# Patient Record
Sex: Female | Born: 1970 | Race: White | Hispanic: No | Smoking: Never smoker
Health system: Southern US, Community
[De-identification: ages and names within clinical notes are randomized; demographics above are authoritative.]

## PROBLEM LIST (undated history)

## (undated) DIAGNOSIS — M1990 Unspecified osteoarthritis, unspecified site: Secondary | ICD-10-CM

## (undated) DIAGNOSIS — E785 Hyperlipidemia, unspecified: Secondary | ICD-10-CM

## (undated) DIAGNOSIS — F32A Depression, unspecified: Secondary | ICD-10-CM

## (undated) HISTORY — DX: Hyperlipidemia, unspecified: E78.5

## (undated) HISTORY — DX: Depression, unspecified: F32.A

## (undated) HISTORY — DX: Unspecified osteoarthritis, unspecified site: M19.90

---

## 1994-02-03 HISTORY — PX: CHOLECYSTECTOMY: SHX55

## 2013-03-10 DIAGNOSIS — G43109 Migraine with aura, not intractable, without status migrainosus: Secondary | ICD-10-CM | POA: Insufficient documentation

## 2015-06-15 DIAGNOSIS — N809 Endometriosis, unspecified: Secondary | ICD-10-CM | POA: Insufficient documentation

## 2018-11-08 DIAGNOSIS — M1990 Unspecified osteoarthritis, unspecified site: Secondary | ICD-10-CM | POA: Insufficient documentation

## 2020-01-25 ENCOUNTER — Encounter: Payer: Self-pay | Admitting: Family Medicine

## 2020-01-25 ENCOUNTER — Other Ambulatory Visit: Payer: Self-pay

## 2020-01-25 ENCOUNTER — Ambulatory Visit: Payer: No Typology Code available for payment source | Admitting: Family Medicine

## 2020-01-25 VITALS — BP 127/76 | HR 81 | Temp 98.9°F | Ht 61.0 in | Wt 177.2 lb

## 2020-01-25 DIAGNOSIS — Z1211 Encounter for screening for malignant neoplasm of colon: Secondary | ICD-10-CM

## 2020-01-25 DIAGNOSIS — E7800 Pure hypercholesterolemia, unspecified: Secondary | ICD-10-CM

## 2020-01-25 DIAGNOSIS — N809 Endometriosis, unspecified: Secondary | ICD-10-CM

## 2020-01-25 DIAGNOSIS — Z1231 Encounter for screening mammogram for malignant neoplasm of breast: Secondary | ICD-10-CM

## 2020-01-25 DIAGNOSIS — Z6833 Body mass index (BMI) 33.0-33.9, adult: Secondary | ICD-10-CM | POA: Diagnosis not present

## 2020-01-25 DIAGNOSIS — K219 Gastro-esophageal reflux disease without esophagitis: Secondary | ICD-10-CM

## 2020-01-25 DIAGNOSIS — F331 Major depressive disorder, recurrent, moderate: Secondary | ICD-10-CM | POA: Diagnosis not present

## 2020-01-25 DIAGNOSIS — Z0000 Encounter for general adult medical examination without abnormal findings: Secondary | ICD-10-CM

## 2020-01-25 MED ORDER — OMEPRAZOLE 20 MG PO CPDR: 20.0000 mg | DELAYED_RELEASE_CAPSULE | Freq: Every day | ORAL | 2 refills | Status: AC

## 2020-01-25 MED ORDER — BUPROPION HCL ER (XL) 150 MG PO TB24: 150.0000 mg | ORAL_TABLET | Freq: Every day | ORAL | 2 refills | Status: DC

## 2020-01-25 NOTE — Patient Instructions (Signed)
American Heart Association (AHA) Exercise Recommendation  Being physically active is important to prevent heart disease and stroke, the nation's No. 1and No. 5killers. To improve overall cardiovascular health, we suggest at least 150 minutes per week of moderate exercise or 75 minutes per week of vigorous exercise (or a combination of moderate and vigorous activity). Thirty minutes a day, five times a week is an easy goal to remember. You will also experience benefits even if you divide your time into two or three segments of 10 to 15 minutes per day.  For people who would benefit from lowering their blood pressure or cholesterol, we recommend 40 minutes of aerobic exercise of moderate to vigorous intensity three to four times a week to lower the risk for heart attack and stroke.  Physical activity is anything that makes you move your body and burn calories.  This includes things like climbing stairs or playing sports. Aerobic exercises benefit your heart, and include walking, jogging, swimming or biking. Strength and stretching exercises are best for overall stamina and flexibility.  The simplest, positive change you can make to effectively improve your heart health is to start walking. It's enjoyable, free, easy, social and great exercise. A walking program is flexible and boasts high success rates because people can stick with it. It's easy for walking to become a regular and satisfying part of life.   For Overall Cardiovascular Health:  At least 30 minutes of moderate-intensity aerobic activity at least 5 days per week for a total of 150  OR   At least 25 minutes of vigorous aerobic activity at least 3 days per week for a total of 75 minutes; or a combination of moderate- and vigorous-intensity aerobic activity  AND   Moderate- to high-intensity muscle-strengthening activity at least 2 days per week for additional health benefits.  For Lowering Blood Pressure and Cholesterol  An  average 40 minutes of moderate- to vigorous-intensity aerobic activity 3 or 4 times per week  What if I can't make it to the time goal? Something is always better than nothing! And everyone has to start somewhere. Even if you've been sedentary for years, today is the day you can begin to make healthy changes in your life. If you don't think you'll make it for 30 or 40 minutes, set a reachable goal for today. You can work up toward your overall goal by increasing your time as you get stronger. Don't let all-or-nothing thinking rob you of doing what you can every day.  Source:http://www.heart.org   High Cholesterol  High cholesterol is a condition in which the blood has high levels of a white, waxy, fat-like substance (cholesterol). The human body needs small amounts of cholesterol. The liver makes all the cholesterol that the body needs. Extra (excess) cholesterol comes from the food that we eat. Cholesterol is carried from the liver by the blood through the blood vessels. If you have high cholesterol, deposits (plaques) may build up on the walls of your blood vessels (arteries). Plaques make the arteries narrower and stiffer. Cholesterol plaques increase your risk for heart attack and stroke. Work with your health care provider to keep your cholesterol levels in a healthy range. What increases the risk? This condition is more likely to develop in people who:  Eat foods that are high in animal fat (saturated fat) or cholesterol.  Are overweight.  Are not getting enough exercise.  Have a family history of high cholesterol. What are the signs or symptoms? There are no symptoms  of this condition. How is this diagnosed? This condition may be diagnosed from the results of a blood test.  If you are older than age 51, your health care provider may check your cholesterol every 4-6 years.  You may be checked more often if you already have high cholesterol or other risk factors for heart  disease. The blood test for cholesterol measures:  "Bad" cholesterol (LDL cholesterol). This is the main type of cholesterol that causes heart disease. The desired level for LDL is less than 100.  "Good" cholesterol (HDL cholesterol). This type helps to protect against heart disease by cleaning the arteries and carrying the LDL away. The desired level for HDL is 60 or higher.  Triglycerides. These are fats that the body can store or burn for energy. The desired number for triglycerides is lower than 150.  Total cholesterol. This is a measure of the total amount of cholesterol in your blood, including LDL cholesterol, HDL cholesterol, and triglycerides. A healthy number is less than 200. How is this treated? This condition is treated with diet changes, lifestyle changes, and medicines. Diet changes  This may include eating more whole grains, fruits, vegetables, nuts, and fish.  This may also include cutting back on red meat and foods that have a lot of added sugar. Lifestyle changes  Changes may include getting at least 40 minutes of aerobic exercise 3 times a week. Aerobic exercises include walking, biking, and swimming. Aerobic exercise along with a healthy diet can help you maintain a healthy weight.  Changes may also include quitting smoking. Medicines  Medicines are usually given if diet and lifestyle changes have failed to reduce your cholesterol to healthy levels.  Your health care provider may prescribe a statin medicine. Statin medicines have been shown to reduce cholesterol, which can reduce the risk of heart disease. Follow these instructions at home: Eating and drinking If told by your health care provider:  Eat chicken (without skin), fish, veal, shellfish, ground [place] breast, and round or loin cuts of red meat.  Do not eat fried foods or fatty meats, such as hot dogs and salami.  Eat plenty of fruits, such as apples.  Eat plenty of vegetables, such as broccoli,  potatoes, and carrots.  Eat beans, peas, and lentils.  Eat grains such as barley, rice, couscous, and bulgur wheat.  Eat pasta without cream sauces.  Use skim or nonfat milk, and eat low-fat or nonfat yogurt and cheeses.  Do not eat or drink whole milk, cream, ice cream, egg yolks, or hard cheeses.  Do not eat stick margarine or tub margarines that contain trans fats (also called partially hydrogenated oils).  Do not eat saturated tropical oils, such as coconut oil and palm oil.  Do not eat cakes, cookies, crackers, or other baked goods that contain trans fats.  General instructions  Exercise as directed by your health care provider. Increase your activity level with activities such as gardening, walking, and taking the stairs.  Take over-the-counter and prescription medicines only as told by your health care provider.  Do not use any products that contain nicotine or tobacco, such as cigarettes and e-cigarettes. If you need help quitting, ask your health care provider.  Keep all follow-up visits as told by your health care provider. This is important. Contact a health care provider if:  You are struggling to maintain a healthy diet or weight.  You need help to start on an exercise program.  You need help to stop smoking. Get  help right away if:  You have chest pain.  You have trouble breathing. This information is not intended to replace advice given to you by your health care provider. Make sure you discuss any questions you have with your health care provider. Document Revised: 01/23/2017 Document Reviewed: 07/21/2015 Elsevier Patient Education  [place].

## 2020-01-25 NOTE — Progress Notes (Signed)
New Patient Office Visit  Subjective:  Patient ID: Amanda [name], female    DOB: 07/12/1970  Age: 49 y.o. MRN: [id number]  CC:  Chief Complaint  Patient presents with  . New Patient (Initial Visit)    HPI [name] presents to establish care.   1. Depression Yilin reports that she has been on and off of Wellbutrin for last 25 years. She has been off of it for the past year. She has noticed an increase in symptoms and feels like it is time to restart her medication. She previously did well on 150 mg daily. She is going though a separation and has started a new job.   Depression screen [facility] 2/9 01/25/2020  Decreased Interest 3  Down, Depressed, Hopeless 3  PHQ - 2 Score 6  Altered sleeping 0  Tired, decreased energy 1  Change in appetite 2  Feeling bad or failure about yourself  0  Trouble concentrating 0  Moving slowly or fidgety/restless 0  Suicidal thoughts 0  PHQ-9 Score 9  Difficult doing work/chores Somewhat difficult    Past Medical History:  Diagnosis Date  . Arthritis   . Depression   . Hyperlipidemia     Past Surgical History:  Procedure Laterality Date  . CHOLECYSTECTOMY  1996    Family History  Problem Relation Age of Onset  . Arthritis Mother   . Hyperlipidemia Mother   . Hypertension Mother   . Alcohol abuse Father   . Cancer Father        lung  . Alcohol abuse Brother   . Anxiety disorder Brother   . Asthma Brother   . Depression Brother   . Alcohol abuse Brother   . Asthma Brother   . Cancer Brother        esophageal  . COPD Brother     Social History   Socioeconomic History  . Marital status: Legally Separated    Spouse name: Not on file  . Number of children: 1  . Years of education: 74  . Highest education level: High school graduate  Occupational History  . Occupation: Accounting  Tobacco Use  . Smoking status: Never Smoker  . Smokeless tobacco: Never Used  Vaping Use  . Vaping Use: Never used  Substance and Sexual  Activity  . Alcohol use: Not Currently  . Drug use: Never  . Sexual activity: Not Currently    Birth control/protection: Post-menopausal  Other Topics Concern  . Not on file  Social History Narrative  . Not on file   Social Determinants of Health   Financial Resource Strain: Not on file  Food Insecurity: Not on file  Transportation Needs: Not on file  Physical Activity: Not on file  Stress: Not on file  Social Connections: Not on file  Intimate Partner Violence: Not on file    ROS Review of Systems Negative unless specially indicated above in HPI.  Objective:   Today's Vitals: BP 127/76   Pulse 81   Temp 98.9 F (37.2 C) (Temporal)   Ht _0  (1.549 m)   Wt 177 lb 4 oz (80.4 kg)   BMI 33.49 kg/m   Physical Exam Vitals and nursing note reviewed.  Constitutional:      General: She is not in acute distress.    Appearance: Normal appearance. She is not ill-appearing, toxic-appearing or diaphoretic.  HENT:     Head: Normocephalic and atraumatic.  Neck:     Vascular: No carotid bruit.  Cardiovascular:  Rate and Rhythm: Normal rate and regular rhythm.     Heart sounds: Normal heart sounds. No murmur heard.   Pulmonary:     Effort: Pulmonary effort is normal. No respiratory distress.     Breath sounds: Normal breath sounds.  Musculoskeletal:     Cervical back: Neck supple.     Right lower leg: No edema.     Left lower leg: No edema.  Skin:    General: Skin is warm and dry.  Neurological:     General: No focal deficit present.     Mental Status: She is alert and oriented to person, place, and time.  Psychiatric:        Mood and Affect: Mood normal.        Behavior: Behavior normal.        Thought Content: Thought content normal.        Judgment: Judgment normal.     Assessment & Plan:   Kelley was seen today for new patient (initial visit).  Diagnoses and all orders for this visit:  Moderate episode of recurrent major depressive disorder (HCC) PHQ  9 score is 9 today. Restart wellbutrin 150 mg daily.  -     buPROPion (WELLBUTRIN XL) 150 MG 24 hr tablet; Take 1 tablet (150 mg total) by mouth daily.  BMI 33.0-33.9,adult Labs pending as below. She has not had anything to eat since this morning. -     CBC with Differential/Platelet -     CMP14+EGFR -     Lipid panel -     TSH  Gastroesophageal reflux disease without esophagitis Well controlled on current regimen.  -     omeprazole (PRILOSEC) 20 MG capsule; Take 1 capsule (20 mg total) by mouth daily.  Hypercholesteremia Labs pending as below.  -     Lipid panel  Endometriosis Managed by [facility].  Encounter for screening mammogram for malignant neoplasm of breast -     MM Digital Screening; Future  Colon cancer screening -     Cologuard  Encounter for medical examination to establish care -     CBC with Differential/Platelet -     CMP14+EGFR -     Lipid panel -     TSH  Follow-up: 3 months for CPE with pap, sooner if needed.   The patient indicates understanding of these issues and agrees with the plan.   Gwenlyn Perking, FNP

## 2020-01-26 ENCOUNTER — Telehealth: Payer: Self-pay | Admitting: Family Medicine

## 2020-01-26 LAB — CMP14+EGFR
ALT: 14 IU/L (ref 0–32)
AST: 14 IU/L (ref 0–40)
Albumin/Globulin Ratio: 1.1 — ABNORMAL LOW (ref 1.2–2.2)
Albumin: 3.9 g/dL (ref 3.8–4.9)
Alkaline Phosphatase: 78 IU/L (ref 44–121)
BUN/Creatinine Ratio: 14 (ref 9–23)
BUN: 11 mg/dL (ref 6–24)
Bilirubin Total: 0.3 mg/dL (ref 0.0–1.2)
CO2: 21 mmol/L (ref 20–29)
Calcium: 9.7 mg/dL (ref 8.7–10.2)
Chloride: 101 mmol/L (ref 96–106)
Creatinine, Ser: 0.77 mg/dL (ref 0.57–1.00)
GFR calc Af Amer: 103 mL/min/{1.73_m2} (ref >59)
GFR calc non Af Amer: 90 mL/min/{1.73_m2} (ref >59)
Globulin, Total: 3.5 g/dL (ref 1.5–4.5)
Glucose: 78 mg/dL (ref 65–99)
Potassium: 4.2 mmol/L (ref 3.5–5.2)
Sodium: 137 mmol/L (ref 134–144)
Total Protein: 7.4 g/dL (ref 6.0–8.5)

## 2020-01-26 LAB — CBC WITH DIFFERENTIAL/PLATELET
Basophils Absolute: 0.1 10*3/uL (ref 0.0–0.2)
Basos: 1 %
EOS (ABSOLUTE): 0.1 10*3/uL (ref 0.0–0.4)
Eos: 2 %
Hematocrit: 37.3 % (ref 34.0–46.6)
Hemoglobin: 12.3 g/dL (ref 11.1–15.9)
Immature Grans (Abs): 0 10*3/uL (ref 0.0–0.1)
Immature Granulocytes: 0 %
Lymphocytes Absolute: 2.2 10*3/uL (ref 0.7–3.1)
Lymphs: 29 %
MCH: 29.6 pg (ref 26.6–33.0)
MCHC: 33 g/dL (ref 31.5–35.7)
MCV: 90 fL (ref 79–97)
Monocytes Absolute: 0.5 10*3/uL (ref 0.1–0.9)
Monocytes: 7 %
Neutrophils Absolute: 4.6 10*3/uL (ref 1.4–7.0)
Neutrophils: 61 %
Platelets: 418 10*3/uL (ref 150–450)
RBC: 4.15 x10E6/uL (ref 3.77–5.28)
RDW: 13.3 % (ref 11.7–15.4)
WBC: 7.5 10*3/uL (ref 3.4–10.8)

## 2020-01-26 LAB — TSH: TSH: 1.11 u[IU]/mL (ref 0.450–4.500)

## 2020-01-26 LAB — LIPID PANEL
Chol/HDL Ratio: 2.6 ratio (ref 0.0–4.4)
Cholesterol, Total: 288 mg/dL — ABNORMAL HIGH (ref 100–199)
HDL: 109 mg/dL (ref >39)
LDL Chol Calc (NIH): 153 mg/dL — ABNORMAL HIGH (ref 0–99)
Triglycerides: 153 mg/dL — ABNORMAL HIGH (ref 0–149)
VLDL Cholesterol Cal: 26 mg/dL (ref 5–40)

## 2020-01-26 NOTE — Telephone Encounter (Signed)
Refer to lab note °

## 2020-01-26 NOTE — Telephone Encounter (Signed)
Pt calling back about results from blood work

## 2020-02-21 ENCOUNTER — Other Ambulatory Visit: Payer: Self-pay | Admitting: Family Medicine

## 2020-02-21 DIAGNOSIS — Z1231 Encounter for screening mammogram for malignant neoplasm of breast: Secondary | ICD-10-CM

## 2020-02-29 ENCOUNTER — Ambulatory Visit
Admission: RE | Admit: 2020-02-29 | Discharge: 2020-02-29 | Disposition: A | Discharge status: Home / Self Care | Payer: No Typology Code available for payment source | Source: Ambulatory Visit | Attending: Family Medicine | Admitting: Family Medicine

## 2020-02-29 ENCOUNTER — Other Ambulatory Visit: Payer: Self-pay

## 2020-02-29 DIAGNOSIS — Z1231 Encounter for screening mammogram for malignant neoplasm of breast: Secondary | ICD-10-CM

## 2020-03-01 ENCOUNTER — Other Ambulatory Visit: Payer: Self-pay | Admitting: *Deleted

## 2020-03-02 ENCOUNTER — Encounter: Payer: Self-pay | Admitting: Family Medicine

## 2020-03-02 MED ORDER — DROSPIRENONE-ETHINYL ESTRADIOL 3-0.03 MG PO TABS: ORAL_TABLET | ORAL | 0 refills | Status: DC

## 2020-03-29 ENCOUNTER — Encounter: Payer: Self-pay | Admitting: *Deleted

## 2020-04-26 ENCOUNTER — Ambulatory Visit (INDEPENDENT_AMBULATORY_CARE_PROVIDER_SITE_OTHER): Payer: No Typology Code available for payment source | Admitting: Family Medicine

## 2020-04-26 ENCOUNTER — Encounter: Payer: Self-pay | Admitting: Family Medicine

## 2020-04-26 ENCOUNTER — Other Ambulatory Visit: Payer: Self-pay

## 2020-04-26 VITALS — BP 138/75 | HR 98 | Temp 99.3°F | Ht 61.0 in | Wt 181.1 lb

## 2020-04-26 DIAGNOSIS — Z0001 Encounter for general adult medical examination with abnormal findings: Secondary | ICD-10-CM

## 2020-04-26 DIAGNOSIS — N809 Endometriosis, unspecified: Secondary | ICD-10-CM | POA: Diagnosis not present

## 2020-04-26 DIAGNOSIS — Z124 Encounter for screening for malignant neoplasm of cervix: Secondary | ICD-10-CM | POA: Diagnosis not present

## 2020-04-26 DIAGNOSIS — Z0000 Encounter for general adult medical examination without abnormal findings: Secondary | ICD-10-CM

## 2020-04-26 DIAGNOSIS — E669 Obesity, unspecified: Secondary | ICD-10-CM | POA: Diagnosis not present

## 2020-04-26 DIAGNOSIS — F331 Major depressive disorder, recurrent, moderate: Secondary | ICD-10-CM

## 2020-04-26 MED ORDER — DROSPIRENONE-ETHINYL ESTRADIOL 3-0.03 MG PO TABS: ORAL_TABLET | ORAL | 1 refills | Status: DC

## 2020-04-26 NOTE — Progress Notes (Signed)
Amanda [name] is a 50 y.o. female presents to office today for annual physical exam examination.    Concerns today include: 1. No concerns  Occupation: accounts payable, Marital status: seperated, Substance use: denies Diet: fair, Exercise: walks, garden, yard work Last eye exam: 2-3 years ago Last dental exam: regularly Last colonoscopy: has cologuard at home Last mammogram: 1/22 Last pap smear: 3-4 years ago Refills needed today: yasmin  Depression screen [facility] 2/9 04/26/2020 01/25/2020  Decreased Interest 0 3  Down, Depressed, Hopeless 1 3  PHQ - 2 Score 1 6  Altered sleeping 0 0  Tired, decreased energy 2 1  Change in appetite 2 2  Feeling bad or failure about yourself  0 0  Trouble concentrating 0 0  Moving slowly or fidgety/restless 0 0  Suicidal thoughts 0 0  PHQ-9 Score 5 9  Difficult doing work/chores Somewhat difficult Somewhat difficult    Past Medical History:  Diagnosis Date  . Arthritis   . Depression   . Hyperlipidemia    Social History   Socioeconomic History  . Marital status: Legally Separated    Spouse name: Not on file  . Number of children: 1  . Years of education: 44  . Highest education level: High school graduate  Occupational History  . Occupation: Accounting  Tobacco Use  . Smoking status: Never Smoker  . Smokeless tobacco: Never Used  Vaping Use  . Vaping Use: Never used  Substance and Sexual Activity  . Alcohol use: Not Currently  . Drug use: Never  . Sexual activity: Not Currently    Birth control/protection: Post-menopausal  Other Topics Concern  . Not on file  Social History Narrative  . Not on file   Social Determinants of Health   Financial Resource Strain: Not on file  Food Insecurity: Not on file  Transportation Needs: Not on file  Physical Activity: Not on file  Stress: Not on file  Social Connections: Not on file  Intimate Partner Violence: Not on file   Past Surgical History:  Procedure Laterality Date  .  CHOLECYSTECTOMY  1996   Family History  Problem Relation Age of Onset  . Arthritis Mother   . Hyperlipidemia Mother   . Hypertension Mother   . Alcohol abuse Father   . Cancer Father        lung  . Alcohol abuse Brother   . Anxiety disorder Brother   . Asthma Brother   . Depression Brother   . Alcohol abuse Brother   . Asthma Brother   . Cancer Brother        esophageal  . COPD Brother     Current Outpatient Medications:  .  aspirin-acetaminophen-caffeine (EXCEDRIN MIGRAINE) 250-250-65 MG tablet, Take by mouth., Disp: , Rfl:  .  buPROPion (WELLBUTRIN XL) 150 MG 24 hr tablet, Take 1 tablet (150 mg total) by mouth daily., Disp: 90 tablet, Rfl: 2 .  Cholecalciferol (VITAMIN D3) 25 MCG (1000 UT) CAPS, Take 1,000 Units by mouth daily., Disp: , Rfl:  .  drospirenone-ethinyl estradiol (YASMIN) 3-0.03 MG tablet, TAKE 1 ACTIVE TABLET BY  MOUTH DAILY, Disp: 84 tablet, Rfl: 0 .  omeprazole (PRILOSEC) 20 MG capsule, Take 1 capsule (20 mg total) by mouth daily., Disp: 90 capsule, Rfl: 2 .  TURMERIC PO, Take 1,200 mg by mouth daily., Disp: , Rfl:  .  vitamin B-12 (CYANOCOBALAMIN) 500 MCG tablet, Take by mouth., Disp: , Rfl:   No Known Allergies   ROS: Review of Systems Pertinent  items noted in HPI and remainder of comprehensive ROS otherwise negative.    Physical exam BP 138/75   Pulse 98   Temp 99.3 F (37.4 C) (Temporal)   Ht $R'5\' 1"'Sp$  (1.549 m)   Wt 181 lb 2 oz (82.2 kg)   BMI 34.22 kg/m  General appearance: alert, cooperative and no distress Head: Normocephalic, without obvious abnormality, atraumatic Eyes: conjunctivae/corneas clear. PERRL, EOM's intact.  Ears: normal TM's and external ear canals both ears Nose: Nares normal. Septum midline. Mucosa normal. No drainage or sinus tenderness. Throat: lips, mucosa, and tongue normal; teeth and gums normal Neck: no adenopathy, no carotid bruit, no JVD, supple, symmetrical, trachea midline and thyroid not enlarged, symmetric, no  tenderness/mass/nodules Back: symmetric, no curvature. ROM normal. No CVA tenderness. Lungs: clear to auscultation bilaterally Heart: regular rate and rhythm, S1, S2 normal, no murmur, click, rub or gallop Abdomen: soft, non-tender; bowel sounds normal; no masses,  no organomegaly Pelvic: cervix normal in appearance, external genitalia normal, no adnexal masses or tenderness, no cervical motion tenderness, rectovaginal septum normal, uterus normal size, shape, and consistency and vagina normal without discharge Extremities: extremities normal, atraumatic, no cyanosis or edema Pulses: 2+ and symmetric Skin: Skin color, texture, turgor normal. No rashes or lesions Lymph nodes: Cervical, supraclavicular, and axillary nodes normal. Neurologic: Alert and oriented X 3, normal strength and tone. Normal symmetric reflexes. Normal coordination and gait    Assessment/ Plan: Amanda [name] here for annual physical exam.   Amanda [name] was seen today for annual exam.  Diagnoses and all orders for this visit:  Routine general medical examination at a health care facility Exam unremarkable. Labs pending as below.  -     CBC with Differential/Platelet -     CMP14+EGFR -     Lipid panel -     TSH -     VITAMIN D 25 Hydroxy (Vit-D Deficiency, Fractures) -     Vitamin B12  Obesity (BMI 30.0-34.9) Labs pending as below. Diet and exercise.  -     CBC with Differential/Platelet -     CMP14+EGFR -     Lipid panel -     TSH  Screening for cervical cancer -     Cytology - PAP  Endometriosis Refill provided. Well controlled on current regimen.  -     drospirenone-ethinyl estradiol (YASMIN) 3-0.03 MG tablet; TAKE 1 ACTIVE TABLET BY  MOUTH DAILY  Depression PHQ 9 score of 5 today, improved from 9 at previous visit. She feels like she is doing well on Wellbutrin and does not want to increase this at time time.   Counseled on healthy lifestyle choices, including diet (rich in fruits, vegetables and lean  meats and low in salt and simple carbohydrates) and exercise (at least 30 minutes of moderate physical activity daily).  Patient to follow up in 1 year for annual exam or sooner if needed.  The above assessment and management plan was discussed with the patient. The patient verbalized understanding of and has agreed to the management plan. Patient is aware to call the clinic if symptoms persist or worsen. Patient is aware when to return to the clinic for a follow-up visit. Patient educated on when it is appropriate to go to the emergency department.   Amanda Smolder, FNP-C [facility] [address] [place], [place] [address] [phone]

## 2020-04-26 NOTE — Patient Instructions (Signed)
Health Maintenance, Female Adopting a healthy lifestyle and getting preventive care are important in promoting health and wellness. Ask your health care provider about:  The right schedule for you to have regular tests and exams.  Things you can do on your own to prevent diseases and keep yourself healthy. What should I know about diet, weight, and exercise? Eat a healthy diet  Eat a diet that includes plenty of vegetables, fruits, low-fat dairy products, and lean protein.  Do not eat a lot of foods that are high in solid fats, added sugars, or sodium.   Maintain a healthy weight Body mass index (BMI) is used to identify weight problems. It estimates body fat based on height and weight. Your health care provider can help determine your BMI and help you achieve or maintain a healthy weight. Get regular exercise Get regular exercise. This is one of the most important things you can do for your health. Most adults should:  Exercise for at least 150 minutes each week. The exercise should increase your heart rate and make you sweat (moderate-intensity exercise).  Do strengthening exercises at least twice a week. This is in addition to the moderate-intensity exercise.  Spend less time sitting. Even light physical activity can be beneficial. Watch cholesterol and blood lipids Have your blood tested for lipids and cholesterol at 52 years of age, then have this test every 5 years. Have your cholesterol levels checked more often if:  Your lipid or cholesterol levels are high.  You are older than 52 years of age.  You are at high risk for heart disease. What should I know about cancer screening? Depending on your health history and family history, you may need to have cancer screening at various ages. This may include screening for:  Breast cancer.  Cervical cancer.  Colorectal cancer.  Skin cancer.  Lung cancer. What should I know about heart disease, diabetes, and high blood  pressure? Blood pressure and heart disease  High blood pressure causes heart disease and increases the risk of stroke. This is more likely to develop in people who have high blood pressure readings, are of African descent, or are overweight.  Have your blood pressure checked: ? Every 3-5 years if you are 38-35 years of age. ? Every year if you are 19 years old or older. Diabetes Have regular diabetes screenings. This checks your fasting blood sugar level. Have the screening done:  Once every three years after age 64 if you are at a normal weight and have a low risk for diabetes.  More often and at a younger age if you are overweight or have a high risk for diabetes. What should I know about preventing infection? Hepatitis B If you have a higher risk for hepatitis B, you should be screened for this virus. Talk with your health care provider to find out if you are at risk for hepatitis B infection. Hepatitis C Testing is recommended for:  Everyone born from [birth year] through 1967.  Anyone with known risk factors for hepatitis C. Sexually transmitted infections (STIs)  Get screened for STIs, including gonorrhea and chlamydia, if: ? You are sexually active and are younger than 53 years of age. ? You are older than 52 years of age and your health care provider tells you that you are at risk for this type of infection. ? Your sexual activity has changed since you were last screened, and you are at increased risk for chlamydia or gonorrhea. Ask your health care  provider if you are at risk.  Ask your health care provider about whether you are at high risk for HIV. Your health care provider may recommend a prescription medicine to help prevent HIV infection. If you choose to take medicine to prevent HIV, you should first get tested for HIV. You should then be tested every 3 months for as long as you are taking the medicine. Pregnancy  If you are about to stop having your period (premenopausal) and  you may become pregnant, seek counseling before you get pregnant.  Take 400 to 800 micrograms (mcg) of folic acid every day if you become pregnant.  Ask for birth control (contraception) if you want to prevent pregnancy. Osteoporosis and menopause Osteoporosis is a disease in which the bones lose minerals and strength with aging. This can result in bone fractures. If you are 38 years old or older, or if you are at risk for osteoporosis and fractures, ask your health care provider if you should:  Be screened for bone loss.  Take a calcium or vitamin D supplement to lower your risk of fractures.  Be given hormone replacement therapy (HRT) to treat symptoms of menopause. Follow these instructions at home: Lifestyle  Do not use any products that contain nicotine or tobacco, such as cigarettes, e-cigarettes, and chewing tobacco. If you need help quitting, ask your health care provider.  Do not use street drugs.  Do not share needles.  Ask your health care provider for help if you need support or information about quitting drugs. Alcohol use  Do not drink alcohol if: ? Your health care provider tells you not to drink. ? You are pregnant, may be pregnant, or are planning to become pregnant.  If you drink alcohol: ? Limit how much you use to 0-1 drink a day. ? Limit intake if you are breastfeeding.  Be aware of how much alcohol is in your drink. In the [place] one drink equals one 12 oz bottle of beer (355 mL), one 5 oz glass of wine (148 mL), or one 1 oz glass of hard liquor (44 mL). General instructions  Schedule regular health, dental, and eye exams.  Stay current with your vaccines.  Tell your health care provider if: ? You often feel depressed. ? You have ever been abused or do not feel safe at home. Summary  Adopting a healthy lifestyle and getting preventive care are important in promoting health and wellness.  Follow your health care provider's instructions about healthy  diet, exercising, and getting tested or screened for diseases.  Follow your health care provider's instructions on monitoring your cholesterol and blood pressure. This information is not intended to replace advice given to you by your health care provider. Make sure you discuss any questions you have with your health care provider. Document Revised: 01/13/2018 Document Reviewed: 01/13/2018 Elsevier Patient Education  2021 [employer].     Why follow it? Research shows. . Those who follow the Mediterranean diet have a reduced risk of heart disease  . The diet is associated with a reduced incidence of Parkinson's and Alzheimer's diseases . People following the diet may have longer life expectancies and lower rates of chronic diseases  . The Dietary Guidelines for Americans recommends the Mediterranean diet as an eating plan to promote health and prevent disease  What Is the Mediterranean Diet?  . Healthy eating plan based on typical foods and recipes of Mediterranean-style cooking . The diet is primarily a plant based diet; these foods should  make up a majority of meals   Starches - Plant based foods should make up a majority of meals - They are an important sources of vitamins, minerals, energy, antioxidants, and fiber - Choose whole grains, foods high in fiber and minimally processed items  - Typical grain sources include wheat, oats, barley, corn, brown rice, bulgar, farro, millet, polenta, couscous  - Various types of beans include chickpeas, lentils, fava beans, black beans, white beans   Fruits  Veggies - Large quantities of antioxidant rich fruits & veggies; 6 or more servings  - Vegetables can be eaten raw or lightly drizzled with oil and cooked  - Vegetables common to the traditional Mediterranean Diet include: artichokes, arugula, beets, broccoli, brussel sprouts, cabbage, carrots, celery, collard greens, cucumbers, eggplant, kale, leeks, lemons, lettuce, mushrooms, okra, onions,  peas, peppers, potatoes, pumpkin, radishes, rutabaga, shallots, spinach, sweet potatoes, turnips, zucchini - Fruits common to the Mediterranean Diet include: apples, apricots, avocados, cherries, clementines, dates, figs, grapefruits, grapes, melons, nectarines, oranges, peaches, pears, pomegranates, strawberries, tangerines  Fats - Replace butter and margarine with healthy oils, such as olive oil, canola oil, and tahini  - Limit nuts to no more than a handful a day  - Nuts include walnuts, almonds, pecans, pistachios, pine nuts  - Limit or avoid candied, honey roasted or heavily salted nuts - Olives are central to the [employer] - can be eaten whole or used in a variety of dishes   Meats Protein - Limiting red meat: no more than a few times a month - When eating red meat: choose lean cuts and keep the portion to the size of deck of cards - Eggs: approx. 0 to 4 times a week  - Fish and lean poultry: at least 2 a week  - Healthy protein sources include, chicken, [place], lean beef, lamb - Increase intake of seafood such as tuna, salmon, trout, mackerel, shrimp, scallops - Avoid or limit high fat processed meats such as sausage and bacon  Dairy - Include moderate amounts of low fat dairy products  - Focus on healthy dairy such as fat free yogurt, skim milk, low or reduced fat cheese - Limit dairy products higher in fat such as whole or 2% milk, cheese, ice cream  Alcohol - Moderate amounts of red wine is ok  - No more than 5 oz daily for women (all ages) and men older than age 61  - No more than 10 oz of wine daily for men younger than 29  Other - Limit sweets and other desserts  - Use herbs and spices instead of salt to flavor foods  - Herbs and spices common to the traditional Mediterranean Diet include: basil, bay leaves, chives, cloves, cumin, fennel, garlic, lavender, marjoram, mint, oregano, parsley, pepper, rosemary, sage, savory, sumac, tarragon, thyme   It's not just a diet,  it's a lifestyle:  . The Mediterranean diet includes lifestyle factors typical of those in the region  . Foods, drinks and meals are best eaten with others and savored . Daily physical activity is important for overall good health . This could be strenuous exercise like running and aerobics . This could also be more leisurely activities such as walking, housework, yard-work, or taking the stairs . Moderation is the key; a balanced and healthy diet accommodates most foods and drinks . Consider portion sizes and frequency of consumption of certain foods   Meal Ideas & Options:  . Breakfast:  o Whole wheat toast or whole wheat [place]  muffins with peanut butter & hard boiled egg o Steel cut oats topped with apples & cinnamon and skim milk  o Fresh fruit: banana, strawberries, melon, berries, peaches  o Smoothies: strawberries, bananas, greek yogurt, peanut butter o Low fat greek yogurt with blueberries and granola  o Egg white omelet with spinach and mushrooms o Breakfast couscous: whole wheat couscous, apricots, skim milk, cranberries  . Sandwiches:  o Hummus and grilled vegetables (peppers, zucchini, squash) on whole wheat bread   o Grilled chicken on whole wheat pita with lettuce, tomatoes, cucumbers or tzatziki  o Tuna salad on whole wheat bread: tuna salad made with greek yogurt, olives, red peppers, capers, green onions o Garlic rosemary lamb pita: lamb sauted with garlic, rosemary, salt & pepper; add lettuce, cucumber, greek yogurt to pita - flavor with lemon juice and black pepper  . Seafood:  o Mediterranean grilled salmon, seasoned with garlic, basil, parsley, lemon juice and black pepper o Shrimp, lemon, and spinach whole-grain pasta salad made with low fat greek yogurt  o Seared scallops with lemon orzo  o Seared tuna steaks seasoned salt, pepper, coriander topped with tomato mixture of olives, tomatoes, olive oil, minced garlic, parsley, green onions and cappers  . Meats:   o Herbed greek chicken salad with kalamata olives, cucumber, feta  o Red bell peppers stuffed with spinach, bulgur, lean ground beef (or lentils) & topped with feta   o Kebabs: skewers of chicken, tomatoes, onions, zucchini, squash  o [place] burgers: made with red onions, mint, dill, lemon juice, feta cheese topped with roasted red peppers . Vegetarian o Cucumber salad: cucumbers, artichoke hearts, celery, red onion, feta cheese, tossed in olive oil & lemon juice  o Hummus and whole grain pita points with a greek salad (lettuce, tomato, feta, olives, cucumbers, red onion) o Lentil soup with celery, carrots made with vegetable broth, garlic, salt and pepper  o Tabouli salad: parsley, bulgur, mint, scallions, cucumbers, tomato, radishes, lemon juice, olive oil, salt and pepper.

## 2020-04-27 ENCOUNTER — Other Ambulatory Visit: Payer: Self-pay | Admitting: Family Medicine

## 2020-04-27 DIAGNOSIS — E559 Vitamin D deficiency, unspecified: Secondary | ICD-10-CM | POA: Insufficient documentation

## 2020-04-27 LAB — VITAMIN D 25 HYDROXY (VIT D DEFICIENCY, FRACTURES): Vit D, 25-Hydroxy: 24.7 ng/mL — ABNORMAL LOW (ref 30.0–100.0)

## 2020-04-27 LAB — LIPID PANEL
Chol/HDL Ratio: 2.6 ratio (ref 0.0–4.4)
Cholesterol, Total: 298 mg/dL — ABNORMAL HIGH (ref 100–199)
HDL: 114 mg/dL (ref >39)
LDL Chol Calc (NIH): 163 mg/dL — ABNORMAL HIGH (ref 0–99)
Triglycerides: 128 mg/dL (ref 0–149)
VLDL Cholesterol Cal: 21 mg/dL (ref 5–40)

## 2020-04-27 LAB — CBC WITH DIFFERENTIAL/PLATELET
Basophils Absolute: 0.1 10*3/uL (ref 0.0–0.2)
Basos: 1 %
EOS (ABSOLUTE): 0.1 10*3/uL (ref 0.0–0.4)
Eos: 1 %
Hematocrit: 36.9 % (ref 34.0–46.6)
Hemoglobin: 12.2 g/dL (ref 11.1–15.9)
Immature Grans (Abs): 0 10*3/uL (ref 0.0–0.1)
Immature Granulocytes: 0 %
Lymphocytes Absolute: 3.2 10*3/uL — ABNORMAL HIGH (ref 0.7–3.1)
Lymphs: 34 %
MCH: 28.9 pg (ref 26.6–33.0)
MCHC: 33.1 g/dL (ref 31.5–35.7)
MCV: 87 fL (ref 79–97)
Monocytes Absolute: 0.7 10*3/uL (ref 0.1–0.9)
Monocytes: 8 %
Neutrophils Absolute: 5.2 10*3/uL (ref 1.4–7.0)
Neutrophils: 56 %
Platelets: 469 10*3/uL — ABNORMAL HIGH (ref 150–450)
RBC: 4.22 x10E6/uL (ref 3.77–5.28)
RDW: 13.1 % (ref 11.7–15.4)
WBC: 9.3 10*3/uL (ref 3.4–10.8)

## 2020-04-27 LAB — CMP14+EGFR
ALT: 15 IU/L (ref 0–32)
AST: 21 IU/L (ref 0–40)
Albumin/Globulin Ratio: 1.3 (ref 1.2–2.2)
Albumin: 4.2 g/dL (ref 3.8–4.9)
Alkaline Phosphatase: 75 IU/L (ref 44–121)
BUN/Creatinine Ratio: 13 (ref 9–23)
BUN: 11 mg/dL (ref 6–24)
Bilirubin Total: 0.3 mg/dL (ref 0.0–1.2)
CO2: 20 mmol/L (ref 20–29)
Calcium: 9.6 mg/dL (ref 8.7–10.2)
Chloride: 101 mmol/L (ref 96–106)
Creatinine, Ser: 0.85 mg/dL (ref 0.57–1.00)
Globulin, Total: 3.3 g/dL (ref 1.5–4.5)
Glucose: 81 mg/dL (ref 65–99)
Potassium: 4 mmol/L (ref 3.5–5.2)
Sodium: 137 mmol/L (ref 134–144)
Total Protein: 7.5 g/dL (ref 6.0–8.5)
eGFR: 82 mL/min/{1.73_m2} (ref >59)

## 2020-04-27 LAB — VITAMIN B12: Vitamin B-12: 813 pg/mL (ref 232–1245)

## 2020-04-27 LAB — TSH: TSH: 1.55 u[IU]/mL (ref 0.450–4.500)

## 2020-04-27 MED ORDER — VITAMIN D (ERGOCALCIFEROL) 1.25 MG (50000 UNIT) PO CAPS
50000.0000 [IU] | ORAL_CAPSULE | ORAL | 0 refills | Status: DC
Start: 2020-04-27 — End: 2020-05-08

## 2020-05-02 LAB — CYTOLOGY - PAP: Adequacy: ABNORMAL

## 2020-05-03 ENCOUNTER — Encounter: Payer: Self-pay | Admitting: Family Medicine

## 2020-05-03 DIAGNOSIS — E559 Vitamin D deficiency, unspecified: Secondary | ICD-10-CM

## 2020-05-08 ENCOUNTER — Encounter: Payer: Self-pay | Admitting: *Deleted

## 2020-05-08 MED ORDER — VITAMIN D (ERGOCALCIFEROL) 1.25 MG (50000 UNIT) PO CAPS: 50000.0000 [IU] | ORAL_CAPSULE | ORAL | 0 refills | Status: DC

## 2020-06-05 ENCOUNTER — Encounter: Payer: Self-pay | Admitting: Nurse Practitioner

## 2020-06-05 ENCOUNTER — Telehealth (INDEPENDENT_AMBULATORY_CARE_PROVIDER_SITE_OTHER): Payer: No Typology Code available for payment source | Admitting: Nurse Practitioner

## 2020-06-05 DIAGNOSIS — R6889 Other general symptoms and signs: Secondary | ICD-10-CM | POA: Diagnosis not present

## 2020-06-05 DIAGNOSIS — R059 Cough, unspecified: Secondary | ICD-10-CM | POA: Diagnosis not present

## 2020-06-05 DIAGNOSIS — U071 COVID-19: Secondary | ICD-10-CM

## 2020-06-05 NOTE — Progress Notes (Signed)
Virtual Visit  Note Due to COVID-19 pandemic this visit was conducted virtually. This visit type was conducted due to national recommendations for restrictions regarding the COVID-19 Pandemic (e.g. social distancing, sheltering in place) in an effort to limit this patient's exposure and mitigate transmission in our community. All issues noted in this document were discussed and addressed.  A physical exam was not performed with this format.  I connected with Amanda Amanda [name] on 06/05/20 at 9:10 by telephone and verified that I am speaking with the correct person using two identifiers. Amanda Amanda [name] is currently located at home and no one is currently with  her during visit. The provider, Amanda Hassell Done, FNP is located in their office at time of visit.  I discussed the limitations, risks, security and privacy concerns of performing an evaluation and management service by telephone and the availability of in person appointments. I also discussed with the patient that there may be a patient responsible charge related to this service. The patient expressed understanding and agreed to proceed.   History and Present Illness:   Chief Complaint: cough  HPI Patient says she started feeling bad on Saturday with cough and congestion. Sunday she started feeling worse. She still felt bad Monday Amanda [name] she stayed home from work and did a covid test which came back positive. She had a fever yesterday but doe snot have one today. Today she has cough and congestion   Review of Systems  Constitutional: Positive for fever (broke last night) and malaise/fatigue. Negative for chills.  HENT: Positive for congestion and sore throat.   Respiratory: Positive for cough.   Musculoskeletal: Negative for myalgias.  Neurological: Positive for headaches.     Observations/Objective: Alert and oriented- answers all questions appropriately No distress Voice hoarse Cough wet sounding   Assessment and Plan: Amanda Amanda [name] in today with chief complaint of No chief complaint on file.   1. Lab test positive for detection of COVID-19 virus 2. Cough 3. Congestion of throat 1. Take meds as prescribed 2. Use a cool mist humidifier especially during the winter months and when heat has been humid. 3. Use saline nose sprays frequently 4. Saline irrigations of the nose can be very helpful if Amanda [name] frequently.  * 4X daily for 1 week*  * Use of a nettie pot can be helpful with this. Follow directions with this* 5. Drink plenty of fluids 6. Keep thermostat turn down low 7.For any cough or congestion  Use plain Mucinex- regular strength or max strength is fine   * Children- consult with Pharmacist for dosing 8. For fever or aces or pains- take tylenol or ibuprofen appropriate for age and weight.  * for fevers greater than 101 orally you may alternate ibuprofen and tylenol every  3 hours.   We discussed paxlovid but patient did not feel her symptoms were bad enough to warrant that.     Follow Up Instructions: prn    I discussed the assessment and treatment plan with the patient. The patient was provided an opportunity to ask questions and all were answered. The patient agreed with the plan and demonstrated an understanding of the instructions.   The patient was advised to call back or seek an in-person evaluation if the symptoms worsen or if the condition fails to improve as anticipated.  The above assessment and management plan was discussed with the patient. The patient verbalized understanding of and has agreed to the management plan. Patient is aware to call the clinic if  symptoms persist or worsen. Patient is aware when to return to the clinic for a follow-up visit. Patient educated on when it is appropriate to go to the emergency department.   Time call ended:  9:22  I provided 12 minutes of  non face-to-face time during this encounter.    Amanda Hassell Done, FNP

## 2020-08-20 ENCOUNTER — Other Ambulatory Visit: Payer: Self-pay | Admitting: Family Medicine

## 2020-08-20 DIAGNOSIS — F331 Major depressive disorder, recurrent, moderate: Secondary | ICD-10-CM

## 2020-10-30 ENCOUNTER — Other Ambulatory Visit: Payer: Self-pay | Admitting: Family Medicine

## 2020-10-30 DIAGNOSIS — N809 Endometriosis, unspecified: Secondary | ICD-10-CM

## 2020-11-06 ENCOUNTER — Other Ambulatory Visit: Payer: Self-pay | Admitting: Family Medicine

## 2020-11-06 DIAGNOSIS — F331 Major depressive disorder, recurrent, moderate: Secondary | ICD-10-CM

## 2020-11-07 NOTE — Telephone Encounter (Signed)
OV 04/26/20 rtc 1 yr

## 2021-01-31 ENCOUNTER — Encounter: Payer: Self-pay | Admitting: Family Medicine

## 2021-02-13 LAB — COLOGUARD: Cologuard: NEGATIVE

## 2021-03-31 ENCOUNTER — Other Ambulatory Visit: Payer: Self-pay | Admitting: Family Medicine

## 2021-03-31 DIAGNOSIS — F331 Major depressive disorder, recurrent, moderate: Secondary | ICD-10-CM

## 2021-04-01 NOTE — Telephone Encounter (Signed)
Made appt 04/22/21

## 2021-04-01 NOTE — Telephone Encounter (Signed)
Amanda [name]. NTBS in March LOV 04/26/20. Mail order sent

## 2021-04-09 ENCOUNTER — Other Ambulatory Visit: Payer: Self-pay | Admitting: Family Medicine

## 2021-04-09 DIAGNOSIS — N809 Endometriosis, unspecified: Secondary | ICD-10-CM

## 2021-04-09 NOTE — Telephone Encounter (Signed)
Patient returning call.

## 2021-04-09 NOTE — Telephone Encounter (Signed)
LMTCB and ask for Abigail Butts to discuss medication and upcoming appt ?

## 2021-04-11 NOTE — Telephone Encounter (Signed)
Patient given appointment for 03/30 with Britney and refill given on Yasmin. ?

## 2021-04-22 ENCOUNTER — Ambulatory Visit: Payer: No Typology Code available for payment source | Admitting: Family Medicine

## 2021-05-02 ENCOUNTER — Ambulatory Visit (INDEPENDENT_AMBULATORY_CARE_PROVIDER_SITE_OTHER): Payer: No Typology Code available for payment source | Admitting: Family Medicine

## 2021-05-02 ENCOUNTER — Ambulatory Visit (INDEPENDENT_AMBULATORY_CARE_PROVIDER_SITE_OTHER): Payer: No Typology Code available for payment source

## 2021-05-02 ENCOUNTER — Encounter: Payer: Self-pay | Admitting: Family Medicine

## 2021-05-02 ENCOUNTER — Other Ambulatory Visit (HOSPITAL_COMMUNITY)
Admission: RE | Admit: 2021-05-02 | Discharge: 2021-05-02 | Disposition: A | Discharge status: Home / Self Care | Payer: No Typology Code available for payment source | Source: Ambulatory Visit | Attending: Family Medicine | Admitting: Family Medicine

## 2021-05-02 VITALS — BP 144/86 | HR 85 | Temp 98.0°F | Ht 61.0 in | Wt 184.8 lb

## 2021-05-02 DIAGNOSIS — Z1151 Encounter for screening for human papillomavirus (HPV): Secondary | ICD-10-CM

## 2021-05-02 DIAGNOSIS — M5441 Lumbago with sciatica, right side: Secondary | ICD-10-CM

## 2021-05-02 DIAGNOSIS — K219 Gastro-esophageal reflux disease without esophagitis: Secondary | ICD-10-CM

## 2021-05-02 DIAGNOSIS — Z113 Encounter for screening for infections with a predominantly sexual mode of transmission: Secondary | ICD-10-CM

## 2021-05-02 DIAGNOSIS — Z124 Encounter for screening for malignant neoplasm of cervix: Secondary | ICD-10-CM | POA: Diagnosis present

## 2021-05-02 DIAGNOSIS — G8929 Other chronic pain: Secondary | ICD-10-CM

## 2021-05-02 DIAGNOSIS — E7800 Pure hypercholesterolemia, unspecified: Secondary | ICD-10-CM

## 2021-05-02 DIAGNOSIS — Z0001 Encounter for general adult medical examination with abnormal findings: Secondary | ICD-10-CM

## 2021-05-02 DIAGNOSIS — Z0000 Encounter for general adult medical examination without abnormal findings: Secondary | ICD-10-CM

## 2021-05-02 DIAGNOSIS — E559 Vitamin D deficiency, unspecified: Secondary | ICD-10-CM | POA: Diagnosis not present

## 2021-05-02 DIAGNOSIS — F331 Major depressive disorder, recurrent, moderate: Secondary | ICD-10-CM | POA: Diagnosis not present

## 2021-05-02 DIAGNOSIS — R030 Elevated blood-pressure reading, without diagnosis of hypertension: Secondary | ICD-10-CM

## 2021-05-02 MED ORDER — DICLOFENAC SODIUM 75 MG PO TBEC: 75.0000 mg | DELAYED_RELEASE_TABLET | Freq: Two times a day (BID) | ORAL | 1 refills | Status: DC

## 2021-05-02 MED ORDER — BUPROPION HCL ER (XL) 150 MG PO TB24: 150.0000 mg | ORAL_TABLET | Freq: Every day | ORAL | 3 refills | Status: DC

## 2021-05-02 NOTE — Progress Notes (Signed)
? ?Assessment & Plan:  ?1. Well adult exam ?Preventive health education provided.  ?- CBC with Differential/Platelet ?- CMP14+EGFR ?- Lipid panel ? ?2. Moderate episode of recurrent major depressive disorder ([place]) ?Well controlled on current regimen.  ?- CMP14+EGFR ?- buPROPion (WELLBUTRIN XL) 150 MG 24 hr tablet; Take 1 tablet (150 mg total) by mouth daily.  Dispense: 90 tablet; Refill: 3 ? ?3. Vitamin D insufficiency ?Labs to assess. ?- VITAMIN D 25 Hydroxy (Vit-D Deficiency, Fractures) ? ?4. Gastroesophageal reflux disease without esophagitis ?Well controlled on current regimen.  ?- CMP14+EGFR ? ?5. Hypercholesteremia ?Labs to assess. ?- Lipid panel ? ?6. Elevated BP without diagnosis of hypertension ?Encouraged to monitor BP at home and return if readings are consistently >140/90. She is going to purchase a blood pressure cuff with her HSA.  ? ?7. Chronic right-sided low back pain with right-sided sciatica ?Uncontrolled. Referring to PT. Encouraged NSAID which I sent; advised she cannot take other NSAIDs at the same time. Continue heating pad. May use muscle rub or Lidoderm patches. ?- DG Lumbar Spine 2-3 Views ?- diclofenac (VOLTAREN) 75 MG EC tablet; Take 1 tablet (75 mg total) by mouth 2 (two) times daily. Do not take with Ibuprofen.  Dispense: 90 tablet; Refill: 1 ?- Ambulatory referral to Physical Therapy ? ?8. Routine screening for STI (sexually transmitted infection) ?- Cytology - PAP ? ?9. Screening for human papillomavirus (HPV) ?- Cytology - PAP ? ?10. Screening for cervical cancer ?- Cytology - PAP ? ? ?Follow-up: Return in about 1 year (around 05/03/2022) for annual physical.  ? ?Hendricks Limes, MSN, APRN, FNP-C ?[facility] ? ?Subjective:  ?Patient ID: Onell Mcmath, female    DOB: 10/10/1970  Age: 51 y.o. MRN: [id number] ? ?Patient Care Team: ?Gwenlyn Perking, FNP as PCP - General (Family Medicine)  ? ?CC:  ?Chief Complaint  ?Patient presents with  ? Annual Exam  ? Back Pain   ?  Ongoing lower back pain but worse the last x 1 year   ? ? ?HPI ?Monzerat Handler presents for her annual physical. ? ?Occupation: accounts payable, Marital status: legally separated, Substance use: none ?Diet: regular, Exercise: none ?Last eye exam: almost 3 years ?Last dental exam: within the past year ?Last colorectal cancer screening: Cologuard on 01/07/2021 with recommended repeat in 3 years ?Last mammogram: 02/29/2020; patient will schedule on her way out today ?Last pap smear: 04/26/2020 unsatisfactory with recommendation to recollect ?Hepatitis C Screening: declined ?Immunizations: Flu Vaccine: declined ?Tdap Vaccine: up to date  ?Shingrix Vaccine: declined  ?COVID-19 Vaccine:  declined booster ? ?Depression: taking Wellbutrin which is working well. ? ? ?  05/02/2021  ?  2:07 PM 04/26/2020  ?  2:42 PM 01/25/2020  ?  3:05 PM  ?Depression screen PHQ 2/9  ?Decreased Interest 0 0 3  ?Down, Depressed, Hopeless 0 1 3  ?PHQ - 2 Score 0 1 6  ?Altered sleeping 0 0 0  ?Tired, decreased energy 0 2 1  ?Change in appetite $RemoveBef'1 2 2  'FzKcnWtoow$ ?Feeling bad or failure about yourself  0 0 0  ?Trouble concentrating 0 0 0  ?Moving slowly or fidgety/restless 0 0 0  ?Suicidal thoughts 0 0 0  ?PHQ-9 Score $RemoveBefo'1 5 9  'jpiilsYlzQu$ ?Difficult doing work/chores Not difficult at all Somewhat difficult Somewhat difficult  ? ? ?Vitamin D Insufficiency: taking a daily OTC supplement. Last vitamin D level low a year ago. ? ?GERD: controlled with omeprazole. ? ?Hyperlipidemia: patient previously advised to work on lifestyle modifications as  a medication was not yet indicated. ? ?Elevated Blood Pressure: patient does not monitor her blood pressure at home.  ? ?Patient reports chronic low back pain that has worsened over the past year, becoming more often. She describes the pain as searing. It does radiate down her right leg. Walking, standing (filing at work), and increased activity make the pain worse. She has taken Ibuprofen, Tylenol, and used a heating pad which do help a  little bit.  ? ?Review of Systems  ?Constitutional:  Negative for chills, fever, malaise/fatigue and weight loss.  ?HENT:  Negative for congestion, ear discharge, ear pain, nosebleeds, sinus pain, sore throat and tinnitus.   ?Eyes:  Negative for blurred vision, double vision, pain, discharge and redness.  ?Respiratory:  Negative for cough, shortness of breath and wheezing.   ?Cardiovascular:  Positive for palpitations (every few months, lasts <1 minute). Negative for chest pain and leg swelling.  ?Gastrointestinal:  Negative for abdominal pain, constipation, diarrhea, heartburn, nausea and vomiting.  ?Genitourinary:  Negative for dysuria, frequency and urgency.  ?Musculoskeletal:  Positive for back pain. Negative for myalgias.  ?Skin:  Negative for rash.  ?Neurological:  Negative for dizziness, seizures, weakness and headaches.  ?Psychiatric/Behavioral:  Negative for depression, substance abuse and suicidal ideas. The patient is not nervous/anxious.   ? ? ?Current Outpatient Medications:  ?  aspirin-acetaminophen-caffeine (EXCEDRIN MIGRAINE) 250-250-65 MG tablet, Take by mouth., Disp: , Rfl:  ?  buPROPion (WELLBUTRIN XL) 150 MG 24 hr tablet, TAKE 1 TABLET DAILY, Disp: 90 tablet, Rfl: 0 ?  Cholecalciferol (VITAMIN D3) 25 MCG (1000 UT) CAPS, Take 1,000 Units by mouth daily., Disp: , Rfl:  ?  drospirenone-ethinyl estradiol (YASMIN) 3-0.03 MG tablet, TAKE 1 ACTIVE TABLET DAILY, Disp: 112 tablet, Rfl: 0 ?  omeprazole (PRILOSEC) 20 MG capsule, Take 1 capsule (20 mg total) by mouth daily., Disp: 90 capsule, Rfl: 2 ?  TURMERIC PO, Take 1,200 mg by mouth daily., Disp: , Rfl:  ?  vitamin B-12 (CYANOCOBALAMIN) 500 MCG tablet, Take by mouth., Disp: , Rfl:  ?  Vitamin D, Ergocalciferol, (DRISDOL) 1.25 MG (50000 UNIT) CAPS capsule, Take 1 capsule (50,000 Units total) by mouth every 7 (seven) days., Disp: 8 capsule, Rfl: 0 ? ?No Known Allergies ? ?Past Medical History:  ?Diagnosis Date  ? Arthritis   ? Depression   ?  Hyperlipidemia   ? ? ?Past Surgical History:  ?Procedure Laterality Date  ? CHOLECYSTECTOMY  1996  ? ? ?Family History  ?Problem Relation Age of Onset  ? Arthritis Mother   ? Hyperlipidemia Mother   ? Hypertension Mother   ? Alcohol abuse Father   ? Cancer Father   ?     lung  ? Alcohol abuse Brother   ? Anxiety disorder Brother   ? Asthma Brother   ? Depression Brother   ? Alcohol abuse Brother   ? Asthma Brother   ? Cancer Brother   ?     esophageal  ? COPD Brother   ? ? ?Social History  ? ?Socioeconomic History  ? Marital status: Legally Separated  ?  Spouse name: Not on file  ? Number of children: 1  ? Years of education: 48  ? Highest education level: High school graduate  ?Occupational History  ? Occupation: Accounting  ?Tobacco Use  ? Smoking status: Never  ? Smokeless tobacco: Never  ?Vaping Use  ? Vaping Use: Never used  ?Substance and Sexual Activity  ? Alcohol use: Not Currently  ? Drug use:  Never  ? Sexual activity: Not Currently  ?  Birth control/protection: Post-menopausal  ?Other Topics Concern  ? Not on file  ?Social History Narrative  ? Not on file  ? ?Social Determinants of Health  ? ?Financial Resource Strain: Not on file  ?Food Insecurity: Not on file  ?Transportation Needs: Not on file  ?Physical Activity: Not on file  ?Stress: Not on file  ?Social Connections: Not on file  ?Intimate Partner Violence: Not on file  ? ? ?  ?Objective:  ?  ?BP (!) 144/86   Pulse 85   Temp 98 ?F (36.7 ?C) (Temporal)   Ht $R'5\' 1"'Kh$  (1.549 m)   Wt 184 lb 12.8 oz (83.8 kg)   SpO2 96%   BMI 34.92 kg/m?  ? ?Wt Readings from Last 3 Encounters:  ?05/02/21 184 lb 12.8 oz (83.8 kg)  ?04/26/20 181 lb 2 oz (82.2 kg)  ?01/25/20 177 lb 4 oz (80.4 kg)  ? ? ?Physical Exam ?Vitals reviewed. Exam conducted with a chaperone present.  ?Constitutional:   ?   General: She is not in acute distress. ?   Appearance: Normal appearance. She is not ill-appearing, toxic-appearing or diaphoretic.  ?HENT:  ?   Head: Normocephalic and  atraumatic.  ?   Right Ear: Tympanic membrane, ear canal and external ear normal. There is no impacted cerumen.  ?   Left Ear: Tympanic membrane, ear canal and external ear normal. There is no impacted cerumen.  ?   Nose: Nose n

## 2021-05-03 LAB — CBC WITH DIFFERENTIAL/PLATELET
Basophils Absolute: 0.1 10*3/uL (ref 0.0–0.2)
Basos: 1 %
EOS (ABSOLUTE): 0.1 10*3/uL (ref 0.0–0.4)
Eos: 1 %
Hematocrit: 38.7 % (ref 34.0–46.6)
Hemoglobin: 12.8 g/dL (ref 11.1–15.9)
Immature Grans (Abs): 0 10*3/uL (ref 0.0–0.1)
Immature Granulocytes: 0 %
Lymphocytes Absolute: 2.8 10*3/uL (ref 0.7–3.1)
Lymphs: 36 %
MCH: 28.7 pg (ref 26.6–33.0)
MCHC: 33.1 g/dL (ref 31.5–35.7)
MCV: 87 fL (ref 79–97)
Monocytes Absolute: 0.7 10*3/uL (ref 0.1–0.9)
Monocytes: 9 %
Neutrophils Absolute: 4.1 10*3/uL (ref 1.4–7.0)
Neutrophils: 53 %
Platelets: 427 10*3/uL (ref 150–450)
RBC: 4.46 x10E6/uL (ref 3.77–5.28)
RDW: 13.6 % (ref 11.7–15.4)
WBC: 7.8 10*3/uL (ref 3.4–10.8)

## 2021-05-03 LAB — LIPID PANEL
Chol/HDL Ratio: 2.9 ratio (ref 0.0–4.4)
Cholesterol, Total: 329 mg/dL — ABNORMAL HIGH (ref 100–199)
HDL: 112 mg/dL (ref >39)
LDL Chol Calc (NIH): 189 mg/dL — ABNORMAL HIGH (ref 0–99)
Triglycerides: 162 mg/dL — ABNORMAL HIGH (ref 0–149)
VLDL Cholesterol Cal: 28 mg/dL (ref 5–40)

## 2021-05-03 LAB — CMP14+EGFR
ALT: 16 IU/L (ref 0–32)
AST: 20 IU/L (ref 0–40)
Albumin/Globulin Ratio: 1.2 (ref 1.2–2.2)
Albumin: 4.1 g/dL (ref 3.8–4.9)
Alkaline Phosphatase: 81 IU/L (ref 44–121)
BUN/Creatinine Ratio: 13 (ref 9–23)
BUN: 12 mg/dL (ref 6–24)
Bilirubin Total: 0.3 mg/dL (ref 0.0–1.2)
CO2: 17 mmol/L — ABNORMAL LOW (ref 20–29)
Calcium: 9.6 mg/dL (ref 8.7–10.2)
Chloride: 104 mmol/L (ref 96–106)
Creatinine, Ser: 0.9 mg/dL (ref 0.57–1.00)
Globulin, Total: 3.4 g/dL (ref 1.5–4.5)
Glucose: 85 mg/dL (ref 70–99)
Potassium: 4.3 mmol/L (ref 3.5–5.2)
Sodium: 139 mmol/L (ref 134–144)
Total Protein: 7.5 g/dL (ref 6.0–8.5)
eGFR: 76 mL/min/{1.73_m2} (ref >59)

## 2021-05-03 LAB — VITAMIN D 25 HYDROXY (VIT D DEFICIENCY, FRACTURES): Vit D, 25-Hydroxy: 26 ng/mL — ABNORMAL LOW (ref 30.0–100.0)

## 2021-05-05 ENCOUNTER — Encounter: Payer: Self-pay | Admitting: Family Medicine

## 2021-05-07 ENCOUNTER — Other Ambulatory Visit: Payer: Self-pay | Admitting: Family Medicine

## 2021-05-07 DIAGNOSIS — Z1231 Encounter for screening mammogram for malignant neoplasm of breast: Secondary | ICD-10-CM

## 2021-05-07 LAB — CYTOLOGY - PAP
Adequacy: ABSENT
Chlamydia: NEGATIVE
Comment: NEGATIVE
Comment: NEGATIVE
Comment: NEGATIVE
Comment: NORMAL
Diagnosis: UNDETERMINED — AB
High risk HPV: NEGATIVE
Neisseria Gonorrhea: NEGATIVE
Trichomonas: NEGATIVE

## 2021-05-13 ENCOUNTER — Ambulatory Visit: Payer: No Typology Code available for payment source | Attending: Family Medicine

## 2021-05-13 DIAGNOSIS — M5441 Lumbago with sciatica, right side: Secondary | ICD-10-CM | POA: Insufficient documentation

## 2021-05-13 DIAGNOSIS — G8929 Other chronic pain: Secondary | ICD-10-CM | POA: Insufficient documentation

## 2021-05-13 NOTE — Therapy (Signed)
[place] ?[facility] ?[address] ?[place], [place], [address] ?Phone: [phone]   Fax:  [phone] ? ?Physical Therapy Evaluation ? ?Patient Details  ?Name: Amanda [name] ?MRN: [id number] ?Date of Birth: February 18, 1970 ?Referring Provider (PT): Blanch Media, FNP ? ? ?Encounter Date: 05/13/2021 ? ? PT End of Session - 05/13/21 0909   ? ? Visit Number 1   ? Number of Visits 8   ? Date for PT Re-Evaluation 08/02/21   ? PT Start Time 0908   ? PT Stop Time 6759   ? PT Time Calculation (min) 37 min   ? Activity Tolerance Patient tolerated treatment well   ? Behavior During Therapy [facility] for tasks assessed/performed   ? ?  ?  ? ?  ? ? ?Past Medical History:  ?Diagnosis Date  ? Arthritis   ? Depression   ? Hyperlipidemia   ? ? ?Past Surgical History:  ?Procedure Laterality Date  ? CHOLECYSTECTOMY  1996  ? ? ?There were no vitals filed for this visit. ? ? ? Subjective Assessment - 05/13/21 0909   ? ? Subjective Patient reports that her back has been hurting for a while now with her pain progressively getting worse. She notes that it stared as mild pain, but began to progress to tingling and searing pain in the back and right leg.   ? Limitations Standing;Walking;House hold activities   ? How long can you stand comfortably? 10 minutes   ? How long can you walk comfortably? <10 minutes   ? Patient Stated Goals reduced pain, be able to stand and walk longer   ? Currently in Pain? Yes   ? Pain Score 8    ? Pain Location Back   ? Pain Orientation Right   ? Pain Descriptors / Indicators Burning;Tingling   ? Pain Type Chronic pain   ? Pain Radiating Towards from low back into the foot   ? Pain Onset More than a month ago   ? Pain Frequency Constant   ? Aggravating Factors  driving, standing   ? Pain Relieving Factors sitting, laying down   ? Effect of Pain on Daily Activities takes longer to do her daily activities   ? ?  ?  ? ?  ? ? ? ? ? [facility] PT Assessment - 05/13/21 0001   ? ?  ? Assessment  ? Medical  Diagnosis Right sided low back pain with right sided sciatica   ? Referring Provider (PT) Blanch Media, [place]   ? Onset Date/Surgical Date --   a while ago  ? Next MD Visit None scheduled at this time   ? Prior Therapy No   ?  ? Precautions  ? Precautions None   ?  ? Restrictions  ? Weight Bearing Restrictions No   ?  ? Balance Screen  ? Has the patient fallen in the past 6 months No   ? Has the patient had a decrease in activity level because of a fear of falling?  No   ? Is the patient reluctant to leave their home because of a fear of falling?  No   ?  ? Home Environment  ? Living Environment Private residence   ?  ? Prior Function  ? Level of Independence Independent   ? Vocation Full time employment   ? Vocation Requirements Prolonged sitting, filing paperwork   ? Leisure walking, yardwork, reading   ?  ? Cognition  ? Overall Cognitive Status Within Functional Limits for tasks assessed   ?  Memory Appears intact   ? Awareness Appears intact   ? Problem Solving Appears intact   ?  ? Sensation  ? Additional Comments Patient reports occasional tingling in the RLE   ?  ? ROM / Strength  ? AROM / PROM / Strength Strength;AROM   ?  ? AROM  ? AROM Assessment Site Lumbar   ? Lumbar Flexion 70   ? Lumbar Extension 30   ? Lumbar - Right Side Bend 25% limited   ? Lumbar - Left Side Bend 25% limited   ? Lumbar - Right Rotation 25% limited   ? Lumbar - Left Rotation 25% limited   tight  ?  ? Strength  ? Strength Assessment Site Knee;Hip;Ankle   ? Right/Left Hip Right;Left   ? Right Hip Flexion 4/5   ? Left Hip Flexion 4/5   ? Right/Left Knee Right;Left   ? Right Knee Flexion 4/5   ? Right Knee Extension 4/5   ? Left Knee Flexion 4/5   ? Left Knee Extension 4+/5   ? Right/Left Ankle Right;Left   ? Right Ankle Dorsiflexion 4/5   ? Left Ankle Dorsiflexion 4/5   ?  ? Palpation  ? Spinal mobility Lumbar: WFL with L2-5 being tender to palpation   L4-5 reproduce familiar referred pain  ? Palpation comment TTP: right lumbar paraspinals   ? ?   ?  ? ?  ? ? ? ? ? ? ? ? ? ? ? ? ? ?Objective measurements completed on examination: See above findings.  ? ? ? ? ? [place] Adult PT Treatment/Exercise - 05/13/21 0001   ? ?  ? Exercises  ? Exercises Lumbar   ?  ? Lumbar Exercises: Stretches  ? Lower Trunk Rotation Other (comment)   2 minutes  ?  ? Lumbar Exercises: Supine  ? Bridge 20 reps;Non-compliant   ? Straight Leg Raise 10 reps   ?  ? Manual Therapy  ? Manual Therapy Joint mobilization;Soft tissue mobilization   ? Joint Mobilization L4-5 grade I-II CPA's   ? Soft tissue mobilization right lumbar paraspinals   ? ?  ?  ? ?  ? ? ? ? ? ? ? ? ? ? ? ? ? ? ? PT Long Term Goals - 05/13/21 1233   ? ?  ? PT LONG TERM GOAL #1  ? Title Patient will be independent with her HEP   ? Time 4   ? Period Weeks   ? Status New   ? Target Date 06/10/21   ?  ? PT LONG TERM GOAL #2  ? Title Patient will be able to stand at least 20 minutes without being limited by her familiar low back pain for improved ability to complete her job demands.   ? Time 4   ? Period Weeks   ? Status New   ? Target Date 06/10/21   ?  ? PT LONG TERM GOAL #3  ? Title Patient will be able to walk at least 20 minutes without being limited by her low back for improved function shopping.   ? Time 4   ? Period Weeks   ? Status New   ? Target Date 06/10/21   ?  ? PT LONG TERM GOAL #4  ? Title Patient will be able to complete her job demands without her pain exceeding 5/10.   ? Time 4   ? Period Weeks   ? Status New   ? Target Date 06/10/21   ? ?  ?  ? ?  ? ? ? ? ? ? ? ? ?  Plan - 05/13/21 1009   ? ? Clinical Impression Statement Patient is a 51 year old female presenting to physical therapy with right low back pain with referred pain down her RLE. She presented to treatment with low to moderate pain severity and irritability. Her pain was reproduced with palpation to her right lumbar paraspinals and joint mobilization to the L4-5. Recommend that she continue with skilled physical therapy to address her remaining  impairments to return to her prior level of function.   ? Personal Factors and Comorbidities Comorbidity 2;Profession;Time since onset of injury/illness/exacerbation   ? Comorbidities OA, depression   ? Examination-Activity Limitations Locomotion Level;Stand   ? Examination-Participation Restrictions Cleaning;Occupation;Community Activity;[employer] Work   ? Stability/Clinical Decision Making Stable/Uncomplicated   ? Clinical Decision Making Low   ? Rehab Potential Good   ? PT Frequency 2x / week   ? PT Duration 4 weeks   ? PT Treatment/Interventions ADLs/Self Care Home Management;Cryotherapy;Electrical Stimulation;Moist Heat;Traction;Neuromuscular re-education;Therapeutic exercise;Therapeutic activities;Functional mobility training;Patient/family education;Manual techniques;Taping   ? PT Next Visit Plan nustep, lumbar and core stabilization, modalities as needed   ? Consulted and Agree with Plan of Care Patient   ? ?  ?  ? ?  ? ? ?Patient will benefit from skilled therapeutic intervention in order to improve the following deficits and impairments:  Difficulty walking, Decreased range of motion, Decreased activity tolerance, Pain, Impaired sensation, Decreased strength, Decreased mobility ? ?Visit Diagnosis: ?Chronic right-sided low back pain with right-sided sciatica ? ? ? ? ?Problem List ?Patient Active Problem List  ? Diagnosis Date Noted  ? Vitamin D deficiency 04/27/2020  ? Moderate episode of recurrent major depressive disorder ([place]) 04/26/2020  ? Obesity (BMI 30.0-34.9) 04/26/2020  ? Arthritis 11/08/2018  ? Endometriosis 06/15/2015  ? Migraine with aura and without status migrainosus, not intractable 03/10/2013  ? GERD (gastroesophageal reflux disease) 01/16/2012  ? Hypercholesteremia 04/04/2011  ? Depression 07/29/2010  ? ? ?Darlin Coco, PT ?05/13/2021, 12:36 PM ? ?[place] ?[facility] ?[address] ?[place], [place], [address] ?Phone: [phone]   Fax:  [phone] ? ?Name:  Prosperity Darrough ?MRN: [id number] ?Date of Birth: Jun 12, 1970 ? ? ?

## 2021-05-17 ENCOUNTER — Ambulatory Visit: Payer: No Typology Code available for payment source

## 2021-05-17 DIAGNOSIS — M5441 Lumbago with sciatica, right side: Secondary | ICD-10-CM | POA: Diagnosis not present

## 2021-05-17 DIAGNOSIS — G8929 Other chronic pain: Secondary | ICD-10-CM

## 2021-05-17 NOTE — Therapy (Signed)
[place] ?[facility] ?[address] ?[place], [place], [address] ?Phone: [phone]   Fax:  [phone] ? ?Physical Therapy Treatment ? ?Patient Details  ?Name: Amanda [name] ?MRN: [id number] ?Date of Birth: Jun 22, 1970 ?Referring Provider (PT): Blanch Media, FNP ? ? ?Encounter Date: 05/17/2021 ? ? PT End of Session - 05/17/21 0743   ? ? Visit Number 2   ? Number of Visits 8   ? Date for PT Re-Evaluation 08/02/21   ? PT Start Time [phone]   ? PT Stop Time 0818   ? PT Time Calculation (min) 44 min   ? Activity Tolerance Patient tolerated treatment well   ? Behavior During Therapy [facility] for tasks assessed/performed   ? ?  ?  ? ?  ? ? ?Past Medical History:  ?Diagnosis Date  ? Arthritis   ? Depression   ? Hyperlipidemia   ? ? ?Past Surgical History:  ?Procedure Laterality Date  ? CHOLECYSTECTOMY  1996  ? ? ?There were no vitals filed for this visit. ? ? Subjective Assessment - 05/17/21 0738   ? ? Subjective Patient reports that some of her HEP has been helping. She notes that she did have two days of her shooting pain between her evaluation and today. Both of these occured at the end of the day   ? Limitations Standing;Walking;House hold activities   ? How long can you stand comfortably? 10 minutes   ? How long can you walk comfortably? <10 minutes   ? Patient Stated Goals reduced pain, be able to stand and walk longer   ? Currently in Pain? Yes   ? Pain Score 1    ? Pain Location Back   ? Pain Orientation Right   ? Pain Onset More than a month ago   ? ?  ?  ? ?  ? ? ? ? ? ? ? ? ? ? ? ? ? ? ? ? ? ? ? ? [place] Adult PT Treatment/Exercise - 05/17/21 0001   ? ?  ? Exercises  ? Exercises Knee/Hip   ?  ? Lumbar Exercises: Aerobic  ? Nustep L4 x 10 minutes   ?  ? Lumbar Exercises: Standing  ? Other Standing Lumbar Exercises Open books   20 reps each  ?  ? Lumbar Exercises: Seated  ? Other Seated Lumbar Exercises Flexion with rotation   30 reps each  ?  ? Lumbar Exercises: Supine  ? Bridge 20  reps;Non-compliant   ?  ? Lumbar Exercises: Sidelying  ? Clam Both;20 reps   ? Hip Abduction Both;20 reps   ?  ? Knee/Hip Exercises: Standing  ? Rocker Board 3 minutes   ?  ? Manual Therapy  ? Manual Therapy Joint mobilization;Soft tissue mobilization   ? Joint Mobilization L4-5 grade I-II CPA's   ? Soft tissue mobilization right lumbar paraspinals   ? ?  ?  ? ?  ? ? ? ? ? ? ? ? ? ? ? ? ? ? ? PT Long Term Goals - 05/13/21 1233   ? ?  ? PT LONG TERM GOAL #1  ? Title Patient will be independent with her HEP   ? Time 4   ? Period Weeks   ? Status New   ? Target Date 06/10/21   ?  ? PT LONG TERM GOAL #2  ? Title Patient will be able to stand at least 20 minutes without being limited by her familiar low back pain for improved ability to complete  her job demands.   ? Time 4   ? Period Weeks   ? Status New   ? Target Date 06/10/21   ?  ? PT LONG TERM GOAL #3  ? Title Patient will be able to walk at least 20 minutes without being limited by her low back for improved function shopping.   ? Time 4   ? Period Weeks   ? Status New   ? Target Date 06/10/21   ?  ? PT LONG TERM GOAL #4  ? Title Patient will be able to complete her job demands without her pain exceeding 5/10.   ? Time 4   ? Period Weeks   ? Status New   ? Target Date 06/10/21   ? ?  ?  ? ?  ? ? ? ? ? ? ? ? Plan - 05/17/21 0744   ? ? Clinical Impression Statement Patient presented to her first follow up with good recall of her HEP. She was introduced to multiple new interventions with moderate difficulty. She required moderate cuing with sidelying straight leg raises to maintain hip abduction while prevention hip flexor engagement. Manual therapy focused on light lumbar joint mobilizations and soft tissue mobilization to the right lumbar paraspinals. She reported that her back felt good upon the conclusion of treatment. She continues to require skilled physical therapy to address her remaining impairments to return to her prior level of function.   ? Personal  Factors and Comorbidities Comorbidity 2;Profession;Time since onset of injury/illness/exacerbation   ? Comorbidities OA, depression   ? Examination-Activity Limitations Locomotion Level;Stand   ? Examination-Participation Restrictions Cleaning;Occupation;Community Activity;[employer] Work   ? Stability/Clinical Decision Making Stable/Uncomplicated   ? Rehab Potential Good   ? PT Frequency 2x / week   ? PT Duration 4 weeks   ? PT Treatment/Interventions ADLs/Self Care Home Management;Cryotherapy;Electrical Stimulation;Moist Heat;Traction;Neuromuscular re-education;Therapeutic exercise;Therapeutic activities;Functional mobility training;Patient/family education;Manual techniques;Taping   ? PT Next Visit Plan nustep, lumbar and core stabilization, modalities as needed   ? Consulted and Agree with Plan of Care Patient   ? ?  ?  ? ?  ? ? ?Patient will benefit from skilled therapeutic intervention in order to improve the following deficits and impairments:  Difficulty walking, Decreased range of motion, Decreased activity tolerance, Pain, Impaired sensation, Decreased strength, Decreased mobility ? ?Visit Diagnosis: ?Chronic right-sided low back pain with right-sided sciatica ? ? ? ? ?Problem List ?Patient Active Problem List  ? Diagnosis Date Noted  ? Vitamin D deficiency 04/27/2020  ? Moderate episode of recurrent major depressive disorder ([place]) 04/26/2020  ? Obesity (BMI 30.0-34.9) 04/26/2020  ? Arthritis 11/08/2018  ? Endometriosis 06/15/2015  ? Migraine with aura and without status migrainosus, not intractable 03/10/2013  ? GERD (gastroesophageal reflux disease) 01/16/2012  ? Hypercholesteremia 04/04/2011  ? Depression 07/29/2010  ? ? ?Darlin Coco, PT ?05/17/2021, 8:58 AM ? ?[place] ?[facility] ?[address] ?[place], [place], [address] ?Phone: [phone]   Fax:  [phone] ? ?Name: Amanda [name] ?MRN: [id number] ?Date of Birth: 1970/10/02 ? ? ? ?

## 2021-05-23 ENCOUNTER — Ambulatory Visit: Payer: No Typology Code available for payment source

## 2021-05-23 DIAGNOSIS — G8929 Other chronic pain: Secondary | ICD-10-CM

## 2021-05-23 DIAGNOSIS — M5441 Lumbago with sciatica, right side: Secondary | ICD-10-CM | POA: Diagnosis not present

## 2021-05-23 NOTE — Therapy (Addendum)
[facility] [place], [place], [address] Phone: [phone]   Fax:  [phone]  Physical Therapy Treatment  Patient Details  Name: Amanda [name] MRN: [id number] Date of Birth: 02/08/1970 Referring Provider (PT): Blanch Media, [employer]   Encounter Date: 05/23/2021   PT End of Session - 05/23/21 0737     Visit Number 3    Number of Visits 8    Date for PT Re-Evaluation 08/02/21    PT Start Time 0732    PT Stop Time 0813    PT Time Calculation (min) 41 min    Activity Tolerance Patient tolerated treatment well    Behavior During Therapy [facility] for tasks assessed/performed             Past Medical History:  Diagnosis Date   Arthritis    Depression    Hyperlipidemia     Past Surgical History:  Procedure Laterality Date   CHOLECYSTECTOMY  1996    There were no vitals filed for this visit.   Subjective Assessment - 05/23/21 0732     Subjective Patient reports that she felt good after her last appointment. She notes that Tuesday was really stressful which caused her whole back to feel tense.    Limitations Standing;Walking;House hold activities    How long can you stand comfortably? 10 minutes    How long can you walk comfortably? <10 minutes    Patient Stated Goals reduced pain, be able to stand and walk longer    Currently in Pain? Yes    Pain Score 1     Pain Location Back    Pain Orientation Lower    Pain Descriptors / Indicators Aching;Dull    Pain Onset More than a month ago                               [facility] PT Treatment/Exercise - 05/23/21 0001       Lumbar Exercises: Aerobic   Nustep L4 x 10 minutes      Lumbar Exercises: Machines for Strengthening   Cybex Lumbar Extension 50# x 2 minutes      Lumbar Exercises: Standing   Row Both;20 reps    Row Limitations Blue XTS    Shoulder Extension Both;20 reps    Shoulder Extension Limitations Blue XTS      Lumbar Exercises: Seated    Other Seated Lumbar Exercises Ball roll out   multidirectional; 2 minutes   Other Seated Lumbar Exercises Slouch overcorrect   15 reps     Knee/Hip Exercises: Standing   Step Down Both;20 reps;Hand Hold: 2;Step Height: 6"    Rocker Board 3 minutes    Other Standing Knee Exercises Side stepping   BUE support; on foam pad; 2 minutes                         PT Long Term Goals - 05/13/21 1233       PT LONG TERM GOAL #1   Title Patient will be independent with her HEP    Time 4    Period Weeks    Status New    [employer] Date 06/10/21      PT LONG TERM GOAL #2   Title Patient will be able to stand at least 20 minutes without being limited by her familiar low back pain for improved ability to complete her job demands.  Time 4    Period Weeks    Status New    Target Date 06/10/21      PT LONG TERM GOAL #3   Title Patient will be able to walk at least 20 minutes without being limited by her low back for improved function shopping.    Time 4    Period Weeks    Status New    Target Date 06/10/21      PT LONG TERM GOAL #4   Title Patient will be able to complete her job demands without her pain exceeding 5/10.    Time 4    Period Weeks    Status New    Target Date 06/10/21                   Plan - 05/23/21 0737     Clinical Impression Statement Patient was introduced to multiple new standing and seated interventions with moderate difficulty. She required minimal cuing with today's new interventions for proper exercise performance. She reported no pain or discomfort with any of today's interventions. She reported that her back felt better upon the conclusion of treatment. She continues to require skilled physical therapy to address her remaining impairments to return to her prior level of function.    Personal Factors and Comorbidities Comorbidity 2;Profession;Time since onset of injury/illness/exacerbation    Comorbidities OA, depression     Examination-Activity Limitations Locomotion Level;Stand    Examination-Participation Restrictions Cleaning;Occupation;Community Activity;Yard Work    Stability/Clinical Decision Making Stable/Uncomplicated    Rehab Potential Good    PT Frequency 2x / week    PT Duration 4 weeks    PT Treatment/Interventions ADLs/Self Care Home Management;Cryotherapy;Electrical Stimulation;Moist Heat;Traction;Neuromuscular re-education;Therapeutic exercise;Therapeutic activities;Functional mobility training;Patient/family education;Manual techniques;Taping    PT Next Visit Plan nustep, lumbar and core stabilization, modalities as needed    Consulted and Agree with Plan of Care Patient             Patient will benefit from skilled therapeutic intervention in order to improve the following deficits and impairments:  Difficulty walking, Decreased range of motion, Decreased activity tolerance, Pain, Impaired sensation, Decreased strength, Decreased mobility  Visit Diagnosis: Chronic right-sided low back pain with right-sided sciatica     Problem List Patient Active Problem List   Diagnosis Date Noted   Vitamin D deficiency 04/27/2020   Moderate episode of recurrent major depressive disorder ([place]) 04/26/2020   Obesity (BMI 30.0-34.9) 04/26/2020   Arthritis 11/08/2018   Endometriosis 06/15/2015   Migraine with aura and without status migrainosus, not intractable 03/10/2013   GERD (gastroesophageal reflux disease) 01/16/2012   Hypercholesteremia 04/04/2011   Depression 07/29/2010   PHYSICAL THERAPY DISCHARGE SUMMARY  Visits from Start of Care: 3  Current functional level related to goals / functional outcomes: Patient had reported experiencing temporary pain relief at her last appointment. However, she did not return to any follow ups after her appointment on 05/23/21.   Remaining deficits: Low back pain    Education / Equipment: HEP   Patient agrees to discharge. Patient goals were not met.  Patient is being discharged due to not returning since the last visit.   Darlin Coco, PT 05/23/2021, 2:37 PM  [facility] [address] [place], [place], [address] Phone: [phone]   Fax:  [phone]  Name: Amanda [name] MRN: [id number] Date of Birth: Jan 05, 1971

## 2021-05-27 ENCOUNTER — Ambulatory Visit
Admission: RE | Admit: 2021-05-27 | Discharge: 2021-05-27 | Disposition: A | Discharge status: Home / Self Care | Payer: No Typology Code available for payment source | Source: Ambulatory Visit | Attending: Family Medicine | Admitting: Family Medicine

## 2021-05-27 DIAGNOSIS — Z1231 Encounter for screening mammogram for malignant neoplasm of breast: Secondary | ICD-10-CM

## 2021-06-07 ENCOUNTER — Ambulatory Visit: Payer: No Typology Code available for payment source

## 2021-07-02 ENCOUNTER — Other Ambulatory Visit: Payer: Self-pay | Admitting: Family Medicine

## 2021-07-02 DIAGNOSIS — N809 Endometriosis, unspecified: Secondary | ICD-10-CM

## 2021-09-24 ENCOUNTER — Other Ambulatory Visit: Payer: Self-pay | Admitting: Family Medicine

## 2021-09-24 DIAGNOSIS — N809 Endometriosis, unspecified: Secondary | ICD-10-CM

## 2021-11-11 ENCOUNTER — Encounter: Payer: Self-pay | Admitting: Family Medicine

## 2021-11-11 ENCOUNTER — Ambulatory Visit: Payer: No Typology Code available for payment source | Admitting: Family Medicine

## 2021-11-11 VITALS — BP 138/83 | HR 94 | Temp 97.0°F | Ht 61.0 in | Wt 179.5 lb

## 2021-11-11 DIAGNOSIS — B350 Tinea barbae and tinea capitis: Secondary | ICD-10-CM | POA: Diagnosis not present

## 2021-11-11 DIAGNOSIS — B354 Tinea corporis: Secondary | ICD-10-CM

## 2021-11-11 DIAGNOSIS — R21 Rash and other nonspecific skin eruption: Secondary | ICD-10-CM

## 2021-11-11 MED ORDER — TERBINAFINE HCL 250 MG PO TABS: 250.0000 mg | ORAL_TABLET | Freq: Every day | ORAL | 0 refills | Status: AC

## 2021-11-11 NOTE — Progress Notes (Signed)
   Acute Office Visit  Subjective:     Patient ID: Amanda [name], female    DOB: 10-May-1970, 51 y.o.   MRN: [id number]  Chief Complaint  Patient presents with  . Rash  . Alopecia    Rash This is a new problem. Episode onset: 2 months ago. The affected locations include the right upper leg, chest and abdomen. The rash is characterized by redness and itchiness. Pertinent negatives include no anorexia, congestion, cough, diarrhea, facial edema, fever, joint pain, rhinorrhea, shortness of breath or sore throat. Treatments tried: antifungal and steroid cream. There is no history of allergies or eczema.   Reports antifungal helped rash on abdomen, chest, and right thigh. Steroid cream was not helpful. A few weeks ago she noticed itchiness of her scalp and thinning of the hair on her right temporal scalp. She has not tried any remedies on her scalp.   Review of Systems  Constitutional:  Negative for fever.  HENT:  Negative for congestion, rhinorrhea and sore throat.   Respiratory:  Negative for cough and shortness of breath.   Gastrointestinal:  Negative for anorexia and diarrhea.  Musculoskeletal:  Negative for joint pain.  Skin:  Positive for rash.        Objective:    BP 138/83   Pulse 94   Temp (!) 97 F (36.1 C) (Temporal)   Ht '5\' 1"'$  (1.549 m)   Wt 179 lb 8 oz (81.4 kg)   SpO2 98%   BMI 33.92 kg/m  {Vitals History (Optional):23777}  Physical Exam  No results found for any visits on 11/11/21.      Assessment & Plan:   Problem List Items Addressed This Visit   None   No orders of the defined types were placed in this encounter.   No follow-ups on file.  Gwenlyn Perking, FNP

## 2021-11-11 NOTE — Patient Instructions (Signed)
Rash, Adult A rash is a change in the color of your skin. A rash can also change the way your skin feels. There are many different conditions and factors that can cause a rash. Some rashes may disappear after a few days, but some may last for a few weeks. Common causes of rashes include: Viral infections, such as: Colds. Measles. Hand, foot, and mouth disease. Bacterial infections, such as: Scarlet fever. Impetigo. Fungal infections, such as Candida. Allergic reactions to food, medicines, or skin care products. Follow these instructions at home: The goal of treatment is to stop the itching and keep the rash from spreading. Pay attention to any changes in your symptoms. Follow these instructions to help with your condition: Medicine Take or apply over-the-counter and prescription medicines only as told by your health care provider. These may include: Corticosteroid creams to treat red or swollen skin. Anti-itch lotions. Oral allergy medicines (antihistamines). Oral corticosteroids for severe symptoms.  Skin care Apply cool compresses to the affected areas. Do not scratch or rub your skin. Avoid covering the rash. Make sure the rash is exposed to air as much as possible. Managing itching and discomfort Avoid hot showers or baths, which can make itching worse. A cold shower may help. Try taking a bath with: Epsom salts. Follow manufacturer instructions on the packaging. You can get these at your local pharmacy or grocery store. Baking soda. Pour a small amount into the bath as told by your health care provider. Colloidal oatmeal. Follow manufacturer instructions on the packaging. You can get this at your local pharmacy or grocery store. Try applying baking soda paste to your skin. Stir water into baking soda until it reaches a paste-like consistency. Try applying calamine lotion. This is an over-the-counter lotion that helps to relieve itchiness. Keep cool and out of the sun. Sweating  and being hot can make itching worse. General instructions  Rest as needed. Drink enough fluid to keep your urine pale yellow. Wear loose-fitting clothing. Avoid scented soaps, detergents, and perfumes. Use gentle soaps, detergents, perfumes, and other cosmetic products. Avoid any substance that causes your rash. Keep a journal to help track what causes your rash. Write down: What you eat. What cosmetic products you use. What you drink. What you wear. This includes jewelry. Keep all follow-up visits as told by your health care provider. This is important. Contact a health care provider if: You sweat at night. You lose weight. You urinate more than normal. You urinate less than normal, or you notice that your urine is a darker color than usual. You feel weak. You vomit. Your skin or the whites of your eyes look yellow (jaundice). Your skin: Tingles. Is numb. Your rash: Does not go away after several days. Gets worse. You are: Unusually thirsty. More tired than normal. You have: New symptoms. Pain in your abdomen. A fever. Diarrhea. Get help right away if you: Have a fever and your symptoms suddenly get worse. Develop confusion. Have a severe headache or a stiff neck. Have severe joint pains or stiffness. Have a seizure. Develop a rash that covers all or most of your body. The rash may or may not be painful. Develop blisters that: Are on top of the rash. Grow larger or grow together. Are painful. Are inside your nose or mouth. Develop a rash that: Looks like purple pinprick-sized spots all over your body. Has a "bull's eye" or looks like a target. Is not related to sun exposure, is red and painful, and causes   your skin to peel. Summary A rash is a change in the color of your skin. Some rashes disappear after a few days, but some may last for a few weeks. The goal of treatment is to stop the itching and keep the rash from spreading. Take or apply over-the-counter  and prescription medicines only as told by your health care provider. Contact a health care provider if you have new or worsening symptoms. Keep all follow-up visits as told by your health care provider. This is important. This information is not intended to replace advice given to you by your health care provider. Make sure you discuss any questions you have with your health care provider. Document Revised: 11/01/2020 Document Reviewed: 11/01/2020 Elsevier Patient Education  2023 Elsevier Inc.  

## 2021-12-11 ENCOUNTER — Other Ambulatory Visit: Payer: Self-pay | Admitting: Family Medicine

## 2021-12-11 DIAGNOSIS — N809 Endometriosis, unspecified: Secondary | ICD-10-CM

## 2022-01-22 ENCOUNTER — Ambulatory Visit (INDEPENDENT_AMBULATORY_CARE_PROVIDER_SITE_OTHER): Payer: No Typology Code available for payment source | Admitting: Family Medicine

## 2022-01-22 ENCOUNTER — Encounter: Payer: Self-pay | Admitting: Family Medicine

## 2022-01-22 VITALS — BP 139/87 | HR 92 | Temp 98.8°F | Ht 61.0 in | Wt 179.4 lb

## 2022-01-22 DIAGNOSIS — N939 Abnormal uterine and vaginal bleeding, unspecified: Secondary | ICD-10-CM | POA: Diagnosis not present

## 2022-01-22 DIAGNOSIS — Z8742 Personal history of other diseases of the female genital tract: Secondary | ICD-10-CM | POA: Diagnosis not present

## 2022-01-22 NOTE — Patient Instructions (Addendum)
[facility] for [facility] [address] [place],  [place]  [address] Get Driving Directions Main: [phone]

## 2022-01-22 NOTE — Progress Notes (Signed)
   Acute Office Visit  Subjective:     Patient ID: Amanda [name], female    DOB: [birth year], 51 y.o.   MRN: [id number]  Chief Complaint  Patient presents with   Menstrual Problem    Pt states this last year it has gotten worse , she has been clouting a lot and is currently on her cycle for 2 weeks     HPI Patient is in today for irregular cycles for the last year. She is on Yasmin and has been for years. Over the last year, she has breakthrough vaginal bleeding, heavy cycles with clotting at times, and increased cramping with cycles. She has currently had vaginal bleeding for the last 2 weeks with heaving bleeding with some clots for the first 5-6 days. Reports light bleeding for the last week or so. She had a pap earlier this year with ASCUS and negative HPV. She denies lightheadedness or dizziness. She has been feeling tired. She denies other vaginal symptoms. She does have a history of endometriosis.    ROS As per HPI.      Objective:    BP 139/87   Pulse 92   Temp 98.8 F (37.1 C)   Ht '5\' 1"'$  (1.549 m)   Wt 179 lb 6.4 oz (81.4 kg)   LMP 01/08/2022 (Approximate)   SpO2 100%   BMI 33.90 kg/m    Physical Exam Vitals and nursing note reviewed.  Constitutional:      General: She is not in acute distress.    Appearance: She is not ill-appearing, toxic-appearing or diaphoretic.  Cardiovascular:     Rate and Rhythm: Normal rate and regular rhythm.     Heart sounds: Normal heart sounds. No murmur heard. Pulmonary:     Effort: Pulmonary effort is normal. No respiratory distress.     Breath sounds: Normal breath sounds.  Abdominal:     General: There is no distension.     Tenderness: There is no abdominal tenderness. There is no guarding or rebound.  Musculoskeletal:     Right lower leg: No edema.     Left lower leg: No edema.  Skin:    General: Skin is warm and dry.  Neurological:     General: No focal deficit present.     Mental Status: She is alert and oriented to  person, place, and time.  Psychiatric:        Mood and Affect: Mood normal.        Behavior: Behavior normal.        Thought Content: Thought content normal.        Judgment: Judgment normal.     No results found for any visits on 01/22/22.      Assessment & Plan:   Yulitza was seen today for menstrual problem.  Diagnoses and all orders for this visit:  Abnormal uterine bleeding History of endometriosis Currently on Yasmin. Will check labs as below. Urgent referral to GYN for further evaluation and management.  -     TSH -     Anemia Profile B -     Ambulatory referral to Gynecology  Return to office for new or worsening symptoms, or if symptoms persist.   The patient indicates understanding of these issues and agrees with the plan.  Gwenlyn Perking, FNP

## 2022-01-23 LAB — ANEMIA PROFILE B
Basophils Absolute: 0 10*3/uL (ref 0.0–0.2)
Basos: 1 %
EOS (ABSOLUTE): 0.3 10*3/uL (ref 0.0–0.4)
Eos: 5 %
Ferritin: 41 ng/mL (ref 15–150)
Folate: 6.5 ng/mL (ref >3.0)
Hematocrit: 37.8 % (ref 34.0–46.6)
Hemoglobin: 12.2 g/dL (ref 11.1–15.9)
Immature Grans (Abs): 0 10*3/uL (ref 0.0–0.1)
Immature Granulocytes: 0 %
Iron Saturation: 12 % — ABNORMAL LOW (ref 15–55)
Iron: 54 ug/dL (ref 27–159)
Lymphocytes Absolute: 1.6 10*3/uL (ref 0.7–3.1)
Lymphs: 31 %
MCH: 29 pg (ref 26.6–33.0)
MCHC: 32.3 g/dL (ref 31.5–35.7)
MCV: 90 fL (ref 79–97)
Monocytes Absolute: 0.6 10*3/uL (ref 0.1–0.9)
Monocytes: 11 %
Neutrophils Absolute: 2.7 10*3/uL (ref 1.4–7.0)
Neutrophils: 52 %
Platelets: 451 10*3/uL — ABNORMAL HIGH (ref 150–450)
RBC: 4.2 x10E6/uL (ref 3.77–5.28)
RDW: 13.2 % (ref 11.7–15.4)
Retic Ct Pct: 1.7 % (ref 0.6–2.6)
Total Iron Binding Capacity: 458 ug/dL — ABNORMAL HIGH (ref 250–450)
UIBC: 404 ug/dL (ref 131–425)
Vitamin B-12: 532 pg/mL (ref 232–1245)
WBC: 5.2 10*3/uL (ref 3.4–10.8)

## 2022-01-23 LAB — TSH: TSH: 1.29 u[IU]/mL (ref 0.450–4.500)

## 2022-02-13 ENCOUNTER — Other Ambulatory Visit (HOSPITAL_COMMUNITY)
Admission: RE | Admit: 2022-02-13 | Discharge: 2022-02-13 | Disposition: A | Discharge status: Home / Self Care | Payer: No Typology Code available for payment source | Source: Ambulatory Visit | Attending: Obstetrics & Gynecology | Admitting: Obstetrics & Gynecology

## 2022-02-13 ENCOUNTER — Encounter: Payer: Self-pay | Admitting: Obstetrics & Gynecology

## 2022-02-13 ENCOUNTER — Ambulatory Visit: Payer: No Typology Code available for payment source | Admitting: Obstetrics & Gynecology

## 2022-02-13 VITALS — BP 136/83 | HR 82 | Ht 61.0 in | Wt 181.0 lb

## 2022-02-13 DIAGNOSIS — Z8742 Personal history of other diseases of the female genital tract: Secondary | ICD-10-CM | POA: Diagnosis not present

## 2022-02-13 DIAGNOSIS — G43109 Migraine with aura, not intractable, without status migrainosus: Secondary | ICD-10-CM | POA: Diagnosis not present

## 2022-02-13 DIAGNOSIS — N939 Abnormal uterine and vaginal bleeding, unspecified: Secondary | ICD-10-CM | POA: Diagnosis present

## 2022-02-13 MED ORDER — MEGESTROL ACETATE 40 MG PO TABS: 40.0000 mg | ORAL_TABLET | Freq: Two times a day (BID) | ORAL | 0 refills | Status: AC

## 2022-02-13 NOTE — Progress Notes (Signed)
GYN VISIT Patient name: Amanda [name] MRN [id number]  Date of birth: December 20, 1970 Chief Complaint:   Menorrhagia  History of Present Illness:   Amanda [name] is a 52 y.o. G56P1001 female being seen today for the following concerns:     She has been on continous OCP for the past 63yrdue to h/o endometriosis.  However, over the past year she has noted some intermittent bleeding- mostly breakthrough bleeding randomly.  Bleeding may be a few days, sometimes bleeding will last for several weeks.  Typically just a few pads per day- may vary with some heaviness- 3x per day with small clots.  Some dysmenorrhea.  Currently bleeding is light to medium- using about 2 pads per day.  Reports no other acute complaints or concerns  Diagnosed with endometriosis with Dr. YKerin Pernavia surgical intervention.  Records attempted to be reviewed- 2008- op note not seen.  Patient's last menstrual period was 01/08/2022 (approximate).     02/13/2022    3:02 PM 01/22/2022    7:59 AM 11/11/2021   10:39 AM 05/02/2021    2:07 PM 04/26/2020    2:42 PM  Depression screen PHQ 2/9  Decreased Interest 0 0 0 0 0  Down, Depressed, Hopeless 0 0 0 0 1  PHQ - 2 Score 0 0 0 0 1  Altered sleeping 0 0 0 0 0  Tired, decreased energy 2 0 0 0 2  Change in appetite 1 0 0 1 2  Feeling bad or failure about yourself  0 0 0 0 0  Trouble concentrating 0 0 0 0 0  Moving slowly or fidgety/restless 0 0 0 0 0  Suicidal thoughts 0 0 0 0 0  PHQ-9 Score 3 0 0 1 5  Difficult doing work/chores  Not difficult at all Not difficult at all Not difficult at all Somewhat difficult     Review of Systems:   Pertinent items are noted in HPI Denies fever/chills, dizziness, headaches, visual disturbances, fatigue, shortness of breath, chest pain, abdominal pain, vomiting, no problems with bowel movements, urination, or intercourse unless otherwise stated above.  Pertinent History Reviewed:  Reviewed past medical,surgical, social, obstetrical and  family history.  Reviewed problem list, medications and allergies. Physical Assessment:   Vitals:   02/13/22 1457  BP: 136/83  Pulse: 82  Weight: 181 lb (82.1 kg)  Height: '5\' 1"'$  (1.549 m)  Body mass index is 34.2 kg/m.       Physical Examination:   General appearance: alert, well appearing, and in no distress  Psych: mood appropriate, normal affect  Skin: warm & dry   Cardiovascular: normal heart rate noted  Respiratory: normal respiratory effort, no distress  Abdomen: soft, non-tender, no rebound, no guarding, uterus below umbilcus  Pelvic: VULVA: normal appearing vulva with no masses, tenderness or lesions, VAGINA: normal appearing vagina with normal color and discharge, no lesions, CERVIX: normal appearing cervix without discharge or lesions, UTERUS: uterus is normal size, shape, consistency and nontender, ADNEXA: normal adnexa in size, nontender and no masses  Extremities: no edema   Chaperone: N/A    Endometrial Biopsy Procedure Note  Pre-operative Diagnosis: abnormal uterine bleeding  Post-operative Diagnosis: same  Procedure Details  The risks (including infection, bleeding, pain, and uterine perforation) and benefits of the procedure were explained to the patient and Written informed consent was obtained.  Antibiotic prophylaxis against endocarditis was not indicated.   The patient was placed in the dorsal lithotomy position.  Bimanual exam showed the uterus to be  in the neutral position.  A speculum inserted in the vagina, and the cervix prepped with betadine.     A single tooth tenaculum was applied to the anterior lip of the cervix for stabilization.  A Pipelle endometrial aspirator was used to sample the endometrium.  Sample was sent for pathologic examination.  Condition: Stable  Complications: None   Assessment & Plan:  1) Abnormal uterine bleeding -discussed work up including pelvic US as well as EMB -EMB obtained, pathology pending The patient was  advised to call for any fever or for prolonged or severe pain or bleeding. She was advised to use OTC analgesics as needed for mild to moderate pain. She was advised to avoid vaginal intercourse for 48 hours or until the bleeding has completely stopped.  -plan for course of megace x 5 days followed by pelvic US following treatment -advised pt to stop OCPs, start Megace and then monitor bleeding -plan to f/u in 71mo or sooner pending results of above  Orders Placed This Encounter  Procedures   [place]PELVIC COMPLETE WITH TRANSVAGINAL   Meds ordered this encounter  Medications   megestrol (MEGACE) 40 MG tablet    Sig: Take 1 tablet (40 mg total) by mouth 2 (two) times daily for 5 days.    Dispense:  10 tablet    Refill:  0    Return in about 6 months (around 08/14/2022) for Follow up.   JJanyth Pupa DO Attending [facility]

## 2022-02-17 LAB — SURGICAL PATHOLOGY

## 2022-02-20 ENCOUNTER — Telehealth: Payer: Self-pay | Admitting: Obstetrics & Gynecology

## 2022-02-20 NOTE — Telephone Encounter (Signed)
Called pt with results of biopsy- inadequate sampling. Megace seemed to slow her bleeding, but then when she stopped her bleeding did return.  Now she is back to bleeding using a few pads per day.  Plan to move forward with scheduling ultrasound For now would prefer to give it another few weeks to see if her bleeding stops on its own.  '[]'$  should bleeding get heavier plan to restart Megace and move forward with hysteroscopy D&C  -questions and concerns were addressed  Janyth Pupa, DO Attending [place], [place] for [place], [place]

## 2022-02-23 ENCOUNTER — Encounter: Payer: Self-pay | Admitting: Obstetrics & Gynecology

## 2022-02-26 ENCOUNTER — Encounter: Payer: Self-pay | Admitting: Obstetrics & Gynecology

## 2022-02-27 ENCOUNTER — Other Ambulatory Visit: Payer: Self-pay | Admitting: Obstetrics & Gynecology

## 2022-02-27 DIAGNOSIS — N939 Abnormal uterine and vaginal bleeding, unspecified: Secondary | ICD-10-CM

## 2022-02-27 MED ORDER — MEGESTROL ACETATE 40 MG PO TABS: 40.0000 mg | ORAL_TABLET | Freq: Every day | ORAL | 3 refills | Status: DC

## 2022-02-27 NOTE — Progress Notes (Signed)
Rx for Megace

## 2022-03-06 ENCOUNTER — Ambulatory Visit (INDEPENDENT_AMBULATORY_CARE_PROVIDER_SITE_OTHER): Payer: No Typology Code available for payment source

## 2022-03-06 DIAGNOSIS — N939 Abnormal uterine and vaginal bleeding, unspecified: Secondary | ICD-10-CM

## 2022-03-06 NOTE — Progress Notes (Signed)
PELVIC US TA/TV: heterogeneous anteverted retroflexed uterus,subserosal fibroid LUS left 2.1 X 1.8 X 1.7 cm,EEC 4.8 mm,normal ovaries (limited view),no free fluid

## 2022-03-17 ENCOUNTER — Ambulatory Visit: Payer: No Typology Code available for payment source | Admitting: Obstetrics & Gynecology

## 2022-03-17 ENCOUNTER — Encounter: Payer: Self-pay | Admitting: Obstetrics & Gynecology

## 2022-03-17 VITALS — BP 129/77 | HR 80 | Ht 61.0 in | Wt 179.0 lb

## 2022-03-17 DIAGNOSIS — D252 Subserosal leiomyoma of uterus: Secondary | ICD-10-CM | POA: Diagnosis not present

## 2022-03-17 DIAGNOSIS — N939 Abnormal uterine and vaginal bleeding, unspecified: Secondary | ICD-10-CM | POA: Diagnosis not present

## 2022-03-17 DIAGNOSIS — N951 Menopausal and female climacteric states: Secondary | ICD-10-CM | POA: Diagnosis not present

## 2022-03-17 MED ORDER — MEGESTROL ACETATE 40 MG PO TABS: 40.0000 mg | ORAL_TABLET | Freq: Every day | ORAL | 3 refills | Status: AC

## 2022-03-17 NOTE — Progress Notes (Signed)
GYN VISIT Patient name: Amanda [name] MRN [id number]  Date of birth: 1970/03/12 Chief Complaint:   Follow-up  History of Present Illness:   Amanda [name] is a 52 y.o. G75P1001 perimenopausal female being seen today for follow up regarding the following:  AUB: In review, she was previously on OCPs due to endometriosis.  She has started to note breakthrough bleeding up to two week.  Plan was to stop pill and given Megace x 5 days.  While the Megace did slow her bleeding once she stopped this medication her bleeding returned.  She was restarted on megace taper then daily pill.  With current dose of Megace she has not had any further bleeding.  EMB- inadequate sampling- endometrium not identified Pelvic US 03/06/2022: Heterogeneous anteverted retroflexed uterus,subserosal fibroid LUS left 2.1 X 1.8 X 1.7 cm,EEC 4.8 mm,normal ovaries (limited view),no free fluid    No LMP recorded. (Menstrual status: Irregular Periods).     02/13/2022    3:02 PM 01/22/2022    7:59 AM 11/11/2021   10:39 AM 05/02/2021    2:07 PM 04/26/2020    2:42 PM  Depression screen PHQ 2/9  Decreased Interest 0 0 0 0 0  Down, Depressed, Hopeless 0 0 0 0 1  PHQ - 2 Score 0 0 0 0 1  Altered sleeping 0 0 0 0 0  Tired, decreased energy 2 0 0 0 2  Change in appetite 1 0 0 1 2  Feeling bad or failure about yourself  0 0 0 0 0  Trouble concentrating 0 0 0 0 0  Moving slowly or fidgety/restless 0 0 0 0 0  Suicidal thoughts 0 0 0 0 0  PHQ-9 Score 3 0 0 1 5  Difficult doing work/chores  Not difficult at all Not difficult at all Not difficult at all Somewhat difficult     Review of Systems:   Pertinent items are noted in HPI Denies fever/chills, dizziness, headaches, visual disturbances, fatigue, shortness of breath, chest pain, abdominal pain, vomiting, no problems with bowel movements, urination, or intercourse unless otherwise stated above.  Pertinent History Reviewed:  Reviewed past medical,surgical, social, obstetrical  and family history.  Reviewed problem list, medications and allergies. Physical Assessment:   Vitals:   03/17/22 1020  BP: 129/77  Pulse: 80  Weight: 179 lb (81.2 kg)  Height: 5' 1"$  (1.549 m)  Body mass index is 33.82 kg/m.       Physical Examination:   General appearance: alert, well appearing, and in no distress  Psych: mood appropriate, normal affect  Skin: warm & dry   Cardiovascular: normal heart rate noted  Respiratory: normal respiratory effort, no distress   Chaperone: N/A    Assessment & Plan:  1) AUB -currently no bleeding with Megace -reviewed pelvic US and biopsy -for now plan to continue with Megace, this year she may consider stopping Megace and watching bleeding pattern.  Should she note bleeding longer for a week, irregular bleeding or other acute concern, advise follow up in our office -otherwise plan to monitor yearly []$  should irregular bleeding return will either retry EMB or may require HSC, D&C -questions/concerns were addressed and pt agreeable to above.  She understands quite well current perimenopausal status  Meds ordered this encounter  Medications   megestrol (MEGACE) 40 MG tablet    Sig: Take 1 tablet (40 mg total) by mouth daily.    Dispense:  90 tablet    Refill:  3     Return in  about 1 year (around 03/18/2023) for Annual.   Janyth Pupa, DO Attending [place], [facility] for [facility], [place]

## 2022-04-18 ENCOUNTER — Encounter: Payer: Self-pay | Admitting: Family Medicine

## 2022-04-18 ENCOUNTER — Telehealth: Payer: No Typology Code available for payment source | Admitting: Family Medicine

## 2022-04-18 DIAGNOSIS — K5900 Constipation, unspecified: Secondary | ICD-10-CM | POA: Diagnosis not present

## 2022-04-18 DIAGNOSIS — K649 Unspecified hemorrhoids: Secondary | ICD-10-CM | POA: Diagnosis not present

## 2022-04-18 MED ORDER — POLYETHYLENE GLYCOL 3350 17 GM/SCOOP PO POWD: 17.0000 g | Freq: Two times a day (BID) | ORAL | 1 refills | Status: DC | PRN

## 2022-04-18 MED ORDER — FLEET ENEMA 7-19 GM/118ML RE ENEM: 1.0000 | ENEMA | Freq: Every day | RECTAL | 1 refills | Status: DC | PRN

## 2022-04-18 NOTE — Progress Notes (Signed)
Virtual Visit via mychart video Note  I connected with Amanda [name] on 04/18/22 at 1533 by video and verified that I am speaking with the correct person using two identifiers. Amanda [name] is currently located at home and patient are currently with her during visit. The provider, Fransisca Kaufmann Gelene Recktenwald, MD is located in their office at time of visit.  Call ended at 1545  I discussed the limitations, risks, security and privacy concerns of performing an evaluation and management service by video and the availability of in person appointments. I also discussed with the patient that there may be a patient responsible charge related to this service. The patient expressed understanding and agreed to proceed.   History and Present Illness: Patient is calling in for pain in the rectum and she has this for a few days.  She has been constipated for 1-2 weeks.  She had to strain a lot. She has a lot of rectal pressure.  She is using preparation H and hemorrhoid pads.  She started stool softener yesterday.  She denies urge to go for BMs today. She denies rectal bleeding or fevers or chills or abd pain.   1. Constipation, unspecified constipation type   2. Hemorrhoids, unspecified hemorrhoid type     Outpatient Encounter Medications as of 04/18/2022  Medication Sig   polyethylene glycol powder (GLYCOLAX/MIRALAX) 17 GM/SCOOP powder Take 17 g by mouth 2 (two) times daily as needed for severe constipation or moderate constipation.   sodium phosphate (FLEET) 7-19 GM/118ML ENEM Place 133 mLs (1 enema total) rectally daily as needed for severe constipation.   aspirin-acetaminophen-caffeine (EXCEDRIN MIGRAINE) 250-250-65 MG tablet Take by mouth.   buPROPion (WELLBUTRIN XL) 150 MG 24 hr tablet Take 1 tablet (150 mg total) by mouth daily.   Cholecalciferol (VITAMIN D3) 25 MCG (1000 UT) CAPS Take 1,000 Units by mouth daily.   diclofenac (VOLTAREN) 75 MG EC tablet Take 1 tablet (75 mg total) by mouth 2 (two) times  daily. Do not take with Ibuprofen.   ferrous sulfate 325 (65 FE) MG EC tablet Take 325 mg by mouth 3 (three) times daily with meals.   megestrol (MEGACE) 40 MG tablet Take 1 tablet (40 mg total) by mouth daily.   omeprazole (PRILOSEC) 20 MG capsule Take 1 capsule (20 mg total) by mouth daily.   vitamin B-12 (CYANOCOBALAMIN) 500 MCG tablet Take by mouth.   No facility-administered encounter medications on file as of 04/18/2022.    Review of Systems  Gastrointestinal:  Positive for constipation. Negative for abdominal pain, blood in stool, diarrhea, nausea and vomiting.    Observations/Objective: Patient sounds comfortable and in no acute distress  Assessment and Plan: Problem List Items Addressed This Visit   None Visit Diagnoses     Constipation, unspecified constipation type    -  Primary   Relevant Medications   polyethylene glycol powder (GLYCOLAX/MIRALAX) 17 GM/SCOOP powder   sodium phosphate (FLEET) 7-19 GM/118ML ENEM   Hemorrhoids, unspecified hemorrhoid type           Recommended fleets and miralax.  Follow up plan: Return if symptoms worsen or fail to improve.     I discussed the assessment and treatment plan with the patient. The patient was provided an opportunity to ask questions and all were answered. The patient agreed with the plan and demonstrated an understanding of the instructions.   The patient was advised to call back or seek an in-person evaluation if the symptoms worsen or if the condition fails  to improve as anticipated.  The above assessment and management plan was discussed with the patient. The patient verbalized understanding of and has agreed to the management plan. Patient is aware to call the clinic if symptoms persist or worsen. Patient is aware when to return to the clinic for a follow-up visit. Patient educated on when it is appropriate to go to the emergency department.    I provided 12 minutes of non-face-to-face time during this  encounter.    Worthy Rancher, MD

## 2022-04-23 ENCOUNTER — Encounter: Payer: No Typology Code available for payment source | Admitting: Family Medicine

## 2022-04-24 ENCOUNTER — Encounter: Payer: No Typology Code available for payment source | Admitting: Family Medicine

## 2022-08-27 ENCOUNTER — Other Ambulatory Visit: Payer: Self-pay | Admitting: Family Medicine

## 2022-08-27 DIAGNOSIS — G8929 Other chronic pain: Secondary | ICD-10-CM

## 2022-08-28 NOTE — Telephone Encounter (Signed)
Tiffany NTBS last chronic FU 05/02/21 NO RF sent to pharmacy

## 2022-08-28 NOTE — Telephone Encounter (Signed)
CPE appt scheduled for 09/04/22

## 2022-09-04 ENCOUNTER — Ambulatory Visit: Payer: No Typology Code available for payment source | Admitting: Family Medicine

## 2022-09-04 ENCOUNTER — Encounter: Payer: Self-pay | Admitting: Family Medicine

## 2022-09-04 VITALS — BP 137/84 | HR 86 | Temp 98.1°F | Ht 61.0 in | Wt 174.2 lb

## 2022-09-04 DIAGNOSIS — E559 Vitamin D deficiency, unspecified: Secondary | ICD-10-CM | POA: Diagnosis not present

## 2022-09-04 DIAGNOSIS — E7800 Pure hypercholesterolemia, unspecified: Secondary | ICD-10-CM

## 2022-09-04 DIAGNOSIS — Z0001 Encounter for general adult medical examination with abnormal findings: Secondary | ICD-10-CM

## 2022-09-04 DIAGNOSIS — Z0000 Encounter for general adult medical examination without abnormal findings: Secondary | ICD-10-CM

## 2022-09-04 DIAGNOSIS — G8929 Other chronic pain: Secondary | ICD-10-CM | POA: Insufficient documentation

## 2022-09-04 DIAGNOSIS — E669 Obesity, unspecified: Secondary | ICD-10-CM | POA: Diagnosis not present

## 2022-09-04 DIAGNOSIS — E611 Iron deficiency: Secondary | ICD-10-CM

## 2022-09-04 DIAGNOSIS — M5441 Lumbago with sciatica, right side: Secondary | ICD-10-CM

## 2022-09-04 LAB — CMP14+EGFR
ALT: 27 IU/L (ref 0–32)
AST: 23 IU/L (ref 0–40)
Albumin: 4.2 g/dL (ref 3.8–4.9)
Alkaline Phosphatase: 118 IU/L (ref 44–121)
BUN/Creatinine Ratio: 13 (ref 9–23)
BUN: 12 mg/dL (ref 6–24)
Bilirubin Total: 0.5 mg/dL (ref 0.0–1.2)
CO2: 20 mmol/L (ref 20–29)
Calcium: 10 mg/dL (ref 8.7–10.2)
Chloride: 102 mmol/L (ref 96–106)
Creatinine, Ser: 0.93 mg/dL (ref 0.57–1.00)
Globulin, Total: 3.3 g/dL (ref 1.5–4.5)
Glucose: 86 mg/dL (ref 70–99)
Potassium: 4.6 mmol/L (ref 3.5–5.2)
Sodium: 138 mmol/L (ref 134–144)
Total Protein: 7.5 g/dL (ref 6.0–8.5)
eGFR: 73 mL/min/{1.73_m2} (ref >59)

## 2022-09-04 LAB — CBC WITH DIFFERENTIAL/PLATELET
Basophils Absolute: 0.1 10*3/uL (ref 0.0–0.2)
Basos: 1 %
EOS (ABSOLUTE): 0.5 10*3/uL — ABNORMAL HIGH (ref 0.0–0.4)
Eos: 9 %
Hematocrit: 38.9 % (ref 34.0–46.6)
Hemoglobin: 12.6 g/dL (ref 11.1–15.9)
Immature Grans (Abs): 0 10*3/uL (ref 0.0–0.1)
Immature Granulocytes: 0 %
Lymphocytes Absolute: 2.1 10*3/uL (ref 0.7–3.1)
Lymphs: 38 %
MCH: 28.8 pg (ref 26.6–33.0)
MCHC: 32.4 g/dL (ref 31.5–35.7)
MCV: 89 fL (ref 79–97)
Monocytes Absolute: 0.5 10*3/uL (ref 0.1–0.9)
Monocytes: 9 %
Neutrophils Absolute: 2.3 10*3/uL (ref 1.4–7.0)
Neutrophils: 43 %
Platelets: 417 10*3/uL (ref 150–450)
RBC: 4.37 x10E6/uL (ref 3.77–5.28)
RDW: 13.7 % (ref 11.7–15.4)
WBC: 5.4 10*3/uL (ref 3.4–10.8)

## 2022-09-04 LAB — VITAMIN D 25 HYDROXY (VIT D DEFICIENCY, FRACTURES)

## 2022-09-04 LAB — IRON,TIBC AND FERRITIN PANEL

## 2022-09-04 LAB — LIPID PANEL
Chol/HDL Ratio: 3.8 ratio (ref 0.0–4.4)
Cholesterol, Total: 250 mg/dL — ABNORMAL HIGH (ref 100–199)
HDL: 66 mg/dL (ref >39)
LDL Chol Calc (NIH): 174 mg/dL — ABNORMAL HIGH (ref 0–99)
Triglycerides: 62 mg/dL (ref 0–149)
VLDL Cholesterol Cal: 10 mg/dL (ref 5–40)

## 2022-09-04 LAB — TSH: TSH: 1.34 u[IU]/mL (ref 0.450–4.500)

## 2022-09-04 MED ORDER — DICLOFENAC SODIUM 75 MG PO TBEC: 75.0000 mg | DELAYED_RELEASE_TABLET | Freq: Two times a day (BID) | ORAL | 1 refills | Status: DC

## 2022-09-04 NOTE — Patient Instructions (Addendum)
Our records indicate that you are due for your screening mammogram.  Please call the imaging center that does your yearly mammograms to make an appointment for a mammogram at your earliest convenience. Our office also has a mobile unit through the [facility] that comes to our location. Please call our office if you would like to make an appointment. Health Maintenance, Female Adopting a healthy lifestyle and getting preventive care are important in promoting health and wellness. Ask your health care provider about: The right schedule for you to have regular tests and exams. Things you can do on your own to prevent diseases and keep yourself healthy. What should I know about diet, weight, and exercise? Eat a healthy diet  Eat a diet that includes plenty of vegetables, fruits, low-fat dairy products, and lean protein. Do not eat a lot of foods that are high in solid fats, added sugars, or sodium. Maintain a healthy weight Body mass index (BMI) is used to identify weight problems. It estimates body fat based on height and weight. Your health care provider can help determine your BMI and help you achieve or maintain a healthy weight. Get regular exercise Get regular exercise. This is one of the most important things you can do for your health. Most adults should: Exercise for at least 150 minutes each week. The exercise should increase your heart rate and make you sweat (moderate-intensity exercise). Do strengthening exercises at least twice a week. This is in addition to the moderate-intensity exercise. Spend less time sitting. Even light physical activity can be beneficial. Watch cholesterol and blood lipids Have your blood tested for lipids and cholesterol at 54 years of age, then have this test every 5 years. Have your cholesterol levels checked more often if: Your lipid or cholesterol levels are high. You are older than 54 years of age. You are at high risk for heart  disease. What should I know about cancer screening? Depending on your health history and family history, you may need to have cancer screening at various ages. This may include screening for: Breast cancer. Cervical cancer. Colorectal cancer. Skin cancer. Lung cancer. What should I know about heart disease, diabetes, and high blood pressure? Blood pressure and heart disease High blood pressure causes heart disease and increases the risk of stroke. This is more likely to develop in people who have high blood pressure readings or are overweight. Have your blood pressure checked: Every 3-5 years if you are 35-31 years of age. Every year if you are 37 years old or older. Diabetes Have regular diabetes screenings. This checks your fasting blood sugar level. Have the screening done: Once every three years after age 25 if you are at a normal weight and have a low risk for diabetes. More often and at a younger age if you are overweight or have a high risk for diabetes. What should I know about preventing infection? Hepatitis B If you have a higher risk for hepatitis B, you should be screened for this virus. Talk with your health care provider to find out if you are at risk for hepatitis B infection. Hepatitis C Testing is recommended for: Everyone born from [birth year] through [birth year]. Anyone with known risk factors for hepatitis C. Sexually transmitted infections (STIs) Get screened for STIs, including gonorrhea and chlamydia, if: You are sexually active and are younger than 54 years of age. You are older than 54 years of age and your health care provider tells you that you are  at risk for this type of infection. Your sexual activity has changed since you were last screened, and you are at increased risk for chlamydia or gonorrhea. Ask your health care provider if you are at risk. Ask your health care provider about whether you are at high risk for HIV. Your health care provider may recommend a  prescription medicine to help prevent HIV infection. If you choose to take medicine to prevent HIV, you should first get tested for HIV. You should then be tested every 3 months for as long as you are taking the medicine. Pregnancy If you are about to stop having your period (premenopausal) and you may become pregnant, seek counseling before you get pregnant. Take 400 to 800 micrograms (mcg) of folic acid every day if you become pregnant. Ask for birth control (contraception) if you want to prevent pregnancy. Osteoporosis and menopause Osteoporosis is a disease in which the bones lose minerals and strength with aging. This can result in bone fractures. If you are 37 years old or older, or if you are at risk for osteoporosis and fractures, ask your health care provider if you should: Be screened for bone loss. Take a calcium or vitamin D supplement to lower your risk of fractures. Be given hormone replacement therapy (HRT) to treat symptoms of menopause. Follow these instructions at home: Alcohol use Do not drink alcohol if: Your health care provider tells you not to drink. You are pregnant, may be pregnant, or are planning to become pregnant. If you drink alcohol: Limit how much you have to: 0-1 drink a day. Know how much alcohol is in your drink. In the [place] one drink equals one 12 oz bottle of beer (355 mL), one 5 oz glass of wine (148 mL), or one 1 oz glass of hard liquor (44 mL). Lifestyle Do not use any products that contain nicotine or tobacco. These products include cigarettes, chewing tobacco, and vaping devices, such as e-cigarettes. If you need help quitting, ask your health care provider. Do not use street drugs. Do not share needles. Ask your health care provider for help if you need support or information about quitting drugs. General instructions Schedule regular health, dental, and eye exams. Stay current with your vaccines. Tell your health care provider if: You often  feel depressed. You have ever been abused or do not feel safe at home. Summary Adopting a healthy lifestyle and getting preventive care are important in promoting health and wellness. Follow your health care provider's instructions about healthy diet, exercising, and getting tested or screened for diseases. Follow your health care provider's instructions on monitoring your cholesterol and blood pressure. This information is not intended to replace advice given to you by your health care provider. Make sure you discuss any questions you have with your health care provider. Document Revised: 06/11/2020 Document Reviewed: 06/11/2020 Elsevier Patient Education  2024 [employer].

## 2022-09-04 NOTE — Progress Notes (Signed)
Complete physical exam  Patient: Amanda [name]   DOB: [birth year]   52 y.o. Female  MRN: [id number]  Subjective:    Chief Complaint  Patient presents with   Annual Exam    Ashlea Allinger is a 52 y.o. female who presents today for a complete physical exam. She reports consuming a  fairly well balanced  diet. The patient does not participate in regular exercise at present. She generally feels well. She reports sleeping well. She does not have additional problems to discuss today.   She has a history of lower back pain and hand pain due to arthritis. She has been taking voltaren prn with good relief. She has also been doing stretching.   Reports heartburn if she doesn't take prilosec for a few day. Using OTC.   Most recent fall risk assessment:    09/04/2022   10:09 AM  Fall Risk   Falls in the past year? 0     Most recent depression screenings:    09/04/2022   10:10 AM 02/13/2022    3:02 PM  PHQ 2/9 Scores  PHQ - 2 Score 0 0  PHQ- 9 Score 0 3    Vision:Not within last year  and Dental: No current dental problems and Receives regular dental care  Past Medical History:  Diagnosis Date   Arthritis    Depression    Hyperlipidemia       Patient Care Team: Gabriel Earing, FNP as PCP - General (Family Medicine)   Outpatient Medications Prior to Visit  Medication Sig   aspirin-acetaminophen-caffeine (EXCEDRIN MIGRAINE) 605 [id number] MG tablet Take by mouth.   buPROPion (WELLBUTRIN XL) 150 MG 24 hr tablet Take 1 tablet (150 mg total) by mouth daily.   Cholecalciferol (VITAMIN D3) 25 MCG (1000 UT) CAPS Take 1,000 Units by mouth daily.   ferrous sulfate 325 (65 FE) MG EC tablet Take 325 mg by mouth 3 (three) times daily with meals.   megestrol (MEGACE) 40 MG tablet Take 40 mg by mouth daily.   omeprazole (PRILOSEC) 20 MG capsule Take 1 capsule (20 mg total) by mouth daily.   vitamin B-12 (CYANOCOBALAMIN) 500 MCG tablet Take by mouth.   diclofenac (VOLTAREN) 75 MG EC tablet Take  1 tablet (75 mg total) by mouth 2 (two) times daily. Do not take with Ibuprofen. (Patient not taking: Reported on 09/04/2022)   [DISCONTINUED] polyethylene glycol powder (GLYCOLAX/MIRALAX) 17 GM/SCOOP powder Take 17 g by mouth 2 (two) times daily as needed for severe constipation or moderate constipation.   [DISCONTINUED] sodium phosphate (FLEET) 7-19 GM/118ML ENEM Place 133 mLs (1 enema total) rectally daily as needed for severe constipation.   No facility-administered medications prior to visit.    ROS Negative unless specially indicated above in HPI.     Objective:     BP 137/84   Pulse 86   Temp 98.1 F (36.7 C) (Temporal)   Ht 5\' 1"  (1.549 m)   Wt 174 lb 4 oz (79 kg)   SpO2 96%   BMI 32.92 kg/m    Physical Exam Vitals and nursing note reviewed.  Constitutional:      General: She is not in acute distress.    Appearance: She is obese. She is not ill-appearing, toxic-appearing or diaphoretic.  HENT:     Head: Normocephalic.     Right Ear: Tympanic membrane, ear canal and external ear normal.     Left Ear: Tympanic membrane, ear canal and external ear normal.  Nose: Nose normal.     Mouth/Throat:     Mouth: Mucous membranes are dry.     Pharynx: Oropharynx is clear.  Eyes:     Extraocular Movements: Extraocular movements intact.     Conjunctiva/sclera: Conjunctivae normal.     Pupils: Pupils are equal, round, and reactive to light.  Neck:     Thyroid: No thyroid mass, thyromegaly or thyroid tenderness.  Cardiovascular:     Rate and Rhythm: Normal rate and regular rhythm.     Pulses: Normal pulses.     Heart sounds: Normal heart sounds. No murmur heard.    No friction rub. No gallop.  Pulmonary:     Effort: Pulmonary effort is normal.     Breath sounds: Normal breath sounds.  Abdominal:     General: Bowel sounds are normal. There is no distension.     Palpations: Abdomen is soft. There is no mass.     Tenderness: There is no abdominal tenderness. There is no  guarding.  Musculoskeletal:        General: No swelling.     Cervical back: Normal range of motion and neck supple. No tenderness.     Right lower leg: No edema.     Left lower leg: No edema.  Skin:    General: Skin is warm and dry.     Capillary Refill: Capillary refill takes less than 2 seconds.     Findings: No lesion or rash.  Neurological:     General: No focal deficit present.     Mental Status: She is alert and oriented to person, place, and time.     Cranial Nerves: No cranial nerve deficit.     Motor: No weakness.     Gait: Gait normal.  Psychiatric:        Mood and Affect: Mood normal.        Behavior: Behavior normal.        Thought Content: Thought content normal.        Judgment: Judgment normal.      No results found for any visits on 09/04/22.     Assessment & Plan:    Routine Health Maintenance and Physical Exam  Jenita was seen today for annual exam.  Diagnoses and all orders for this visit:  Routine general medical examination at a health care facility Will schedule mammo on bus. Declined HIV and hep C screening. Declined shingles vaccine.   Hypercholesteremia Diet and exercise. Labs pending.  -     CBC with Differential/Platelet -     Lipid panel -     CMP14+EGFR  Vitamin D deficiency -     VITAMIN D 25 Hydroxy (Vit-D Deficiency, Fractures)  Obesity (BMI 30.0-34.9) Diet and exercise. Labs pending.  -     TSH  Iron deficiency On iron supplement. Labs pending.  -     Iron, TIBC and Ferritin Panel  Chronic right-sided low back pain with right-sided sciatica Well controlled on current regimen.  -     diclofenac (VOLTAREN) 75 MG EC tablet; Take 1 tablet (75 mg total) by mouth 2 (two) times daily. Do not take with Ibuprofen.    Immunization History  Administered Date(s) Administered   Janssen (J&J) SARS-COV-2 Vaccination 06/30/2019   Tdap 11/22/2007, 10/21/2017    Health Maintenance  Topic Date Due   MAMMOGRAM  05/28/2022   INFLUENZA  VACCINE  05/04/2023 (Originally 09/04/2022)   Hepatitis C Screening  09/04/2023 (Originally 04/24/1986)   HIV Screening  09/04/2023 (  Originally 04/24/1983)   COVID-19 Vaccine (2 - 2023-24 season) 09/20/2023 (Originally 10/04/2021)   Zoster Vaccines- Shingrix (1 of 2) 12/05/2023 (Originally 04/24/2018)   Fecal DNA (Cologuard)  01/08/2024   PAP SMEAR-Modifier  05/02/2024   DTaP/Tdap/Td (3 - Td or Tdap) 10/22/2027   HPV VACCINES  Aged Out    Discussed health benefits of physical activity, and encouraged her to engage in regular exercise appropriate for her age and condition.  Problem List Items Addressed This Visit       Musculoskeletal and Integument   Arthritis   Relevant Medications   megestrol (MEGACE) 40 MG tablet     Other   Hypercholesteremia   Relevant Orders   CBC with Differential/Platelet   Lipid panel   CMP14+EGFR   Obesity (BMI 30.0-34.9)   Relevant Orders   TSH   Vitamin D deficiency   Relevant Orders   VITAMIN D 25 Hydroxy (Vit-D Deficiency, Fractures)   Other Visit Diagnoses     Routine general medical examination at a health care facility    -  Primary   Iron deficiency       Relevant Orders   Iron, TIBC and Ferritin Panel      Return in about 1 year (around 09/04/2023) for CPE.   The patient indicates understanding of these issues and agrees with the plan.  Gabriel Earing, FNP

## 2022-09-05 ENCOUNTER — Other Ambulatory Visit: Payer: Self-pay | Admitting: Family Medicine

## 2022-09-05 DIAGNOSIS — E559 Vitamin D deficiency, unspecified: Secondary | ICD-10-CM

## 2022-09-05 MED ORDER — VITAMIN D (ERGOCALCIFEROL) 1.25 MG (50000 UNIT) PO CAPS
50000.0000 [IU] | ORAL_CAPSULE | ORAL | 0 refills | Status: DC
Start: 2022-09-05 — End: 2023-06-05

## 2022-09-26 ENCOUNTER — Other Ambulatory Visit: Payer: Self-pay | Admitting: Family Medicine

## 2022-09-26 DIAGNOSIS — Z1231 Encounter for screening mammogram for malignant neoplasm of breast: Secondary | ICD-10-CM

## 2022-10-13 ENCOUNTER — Inpatient Hospital Stay: Admission: RE | Admit: 2022-10-13 | Payer: No Typology Code available for payment source | Source: Ambulatory Visit

## 2023-01-11 ENCOUNTER — Encounter: Payer: Self-pay | Admitting: Family Medicine

## 2023-01-11 DIAGNOSIS — F331 Major depressive disorder, recurrent, moderate: Secondary | ICD-10-CM

## 2023-01-12 MED ORDER — BUPROPION HCL ER (XL) 150 MG PO TB24: 150.0000 mg | ORAL_TABLET | Freq: Every day | ORAL | 0 refills | Status: DC

## 2023-03-16 ENCOUNTER — Ambulatory Visit
Admission: RE | Admit: 2023-03-16 | Discharge: 2023-03-16 | Disposition: A | Discharge status: Home / Self Care | Payer: No Typology Code available for payment source | Source: Ambulatory Visit | Attending: Family Medicine | Admitting: Family Medicine

## 2023-03-16 DIAGNOSIS — Z1231 Encounter for screening mammogram for malignant neoplasm of breast: Secondary | ICD-10-CM

## 2023-03-25 ENCOUNTER — Other Ambulatory Visit: Payer: Self-pay | Admitting: Family Medicine

## 2023-03-25 DIAGNOSIS — F331 Major depressive disorder, recurrent, moderate: Secondary | ICD-10-CM

## 2023-03-26 ENCOUNTER — Encounter: Payer: Self-pay | Admitting: Family Medicine

## 2023-03-26 NOTE — Telephone Encounter (Signed)
 LMTCB to schedule appt Letter mailed

## 2023-04-06 ENCOUNTER — Encounter: Payer: Self-pay | Admitting: Obstetrics & Gynecology

## 2023-04-06 ENCOUNTER — Other Ambulatory Visit: Payer: Self-pay | Admitting: *Deleted

## 2023-04-06 MED ORDER — MEGESTROL ACETATE 40 MG PO TABS: 40.0000 mg | ORAL_TABLET | Freq: Every day | ORAL | 0 refills | Status: DC

## 2023-04-29 ENCOUNTER — Other Ambulatory Visit: Payer: Self-pay | Admitting: Family Medicine

## 2023-04-29 DIAGNOSIS — F331 Major depressive disorder, recurrent, moderate: Secondary | ICD-10-CM

## 2023-04-30 ENCOUNTER — Encounter: Payer: Self-pay | Admitting: Family Medicine

## 2023-04-30 NOTE — Telephone Encounter (Signed)
 Amanda [name] pt NTBS 30-d given 03/26/23

## 2023-04-30 NOTE — Telephone Encounter (Signed)
 Tried to call pt and number didn't work. Mailed letter

## 2023-06-05 ENCOUNTER — Ambulatory Visit: Admitting: Family Medicine

## 2023-06-05 VITALS — BP 134/85 | HR 90 | Temp 97.2°F | Ht 61.0 in | Wt 179.0 lb

## 2023-06-05 DIAGNOSIS — E559 Vitamin D deficiency, unspecified: Secondary | ICD-10-CM

## 2023-06-05 DIAGNOSIS — E611 Iron deficiency: Secondary | ICD-10-CM

## 2023-06-05 DIAGNOSIS — M5441 Lumbago with sciatica, right side: Secondary | ICD-10-CM | POA: Diagnosis not present

## 2023-06-05 DIAGNOSIS — F339 Major depressive disorder, recurrent, unspecified: Secondary | ICD-10-CM

## 2023-06-05 DIAGNOSIS — G8929 Other chronic pain: Secondary | ICD-10-CM

## 2023-06-05 DIAGNOSIS — F331 Major depressive disorder, recurrent, moderate: Secondary | ICD-10-CM

## 2023-06-05 DIAGNOSIS — E7800 Pure hypercholesterolemia, unspecified: Secondary | ICD-10-CM

## 2023-06-05 DIAGNOSIS — E66811 Obesity, class 1: Secondary | ICD-10-CM

## 2023-06-05 MED ORDER — METHYLPREDNISOLONE ACETATE 80 MG/ML IJ SUSP
80.0000 mg | Freq: Once | INTRAMUSCULAR | Status: AC
Start: 2023-06-05 — End: 2023-06-05
  Administered 2023-06-05: 80 mg via INTRAMUSCULAR

## 2023-06-05 MED ORDER — BUPROPION HCL ER (XL) 150 MG PO TB24
150.0000 mg | ORAL_TABLET | Freq: Every day | ORAL | 3 refills | Status: AC
Start: 2023-06-05 — End: ?

## 2023-06-05 NOTE — Progress Notes (Signed)
 Established Patient Office Visit  Subjective   Patient ID: Amanda [name], female    DOB: Jul 18, 1970  Age: 53 y.o. MRN: [id number]  Chief Complaint  Patient presents with   Medical Management of Chronic Issues    HPI Besty is here for follow up of depression. She has been on wellbutrin  with good control. She is in need of refills for this today. Denies side effects.   Hx of chronic lower back pain with scoliosis. Pain is constant, worse with activity and gradually worsening. She has been home PT exercises, voltaren , tylenol with mild relief. Occasional pain that radiates down leg if she does increased activity. Denies changes in bowel or bladder control, saddles anesthesia, numbness, tingling. Had xray in 2023 with scolicosis and DDD. She is interested in trying a steroid injection to see if this will provide any relief.      06/05/2023    9:24 AM 06/05/2023    9:20 AM 09/04/2022   10:10 AM  Depression screen PHQ 2/9  Decreased Interest  0 0  Down, Depressed, Hopeless  0 0  PHQ - 2 Score  0 0  Altered sleeping  0 0  Tired, decreased energy  0 0  Change in appetite 1 0 0  Feeling bad or failure about yourself   0 0  Trouble concentrating  0 0  Moving slowly or fidgety/restless  0 0  Suicidal thoughts  0 0  PHQ-9 Score  0 0  Difficult doing work/chores  Not difficult at all Not difficult at all      06/05/2023    9:24 AM 06/05/2023    9:20 AM 09/04/2022   10:09 AM 02/13/2022    3:02 PM  GAD 7 : Generalized Anxiety Score  Nervous, Anxious, on Edge 1 0 0 0  Control/stop worrying  0 0 0  Worry too much - different things  0 0 0  Trouble relaxing  0 0 0  Restless  0 0 0  Easily annoyed or irritable  0 0 0  Afraid - awful might happen  0 0 0  Total GAD 7 Score  0 0 0  Anxiety Difficulty  Not difficult at all Not difficult at all        ROS As per HPI.    Objective:     BP 134/85   Pulse 90   Temp (!) 97.2 F (36.2 C)   Ht 5\' 1"  (1.549 m)   Wt 179 lb (81.2 kg)   SpO2  97%   BMI 33.82 kg/m    Physical Exam Vitals and nursing note reviewed.  Constitutional:      General: She is not in acute distress.    Appearance: She is not ill-appearing, toxic-appearing or diaphoretic.  Cardiovascular:     Rate and Rhythm: Normal rate and regular rhythm.     Heart sounds: Normal heart sounds. No murmur heard. Pulmonary:     Effort: Pulmonary effort is normal. No respiratory distress.     Breath sounds: Normal breath sounds. No wheezing.  Musculoskeletal:     Lumbar back: Tenderness (right muscular) present. No swelling or bony tenderness. Positive right straight leg raise test. Negative left straight leg raise test.     Right lower leg: No edema.     Left lower leg: No edema.  Skin:    General: Skin is warm and dry.  Neurological:     General: No focal deficit present.     Mental Status: She is alert  and oriented to person, place, and time.  Psychiatric:        Mood and Affect: Mood normal.        Behavior: Behavior normal.      No results found for any visits on 06/05/23.    The 10-year ASCVD risk score (Arnett DK, et al., 2019) is: 2.4%    Assessment & Plan:   Amanda [name] was seen today for medical management of chronic issues.  Diagnoses and all orders for this visit:  Depression, recurrent (HCC) Well controlled on current regimen.  -     buPROPion  (WELLBUTRIN  XL) 150 MG 24 hr tablet; Take 1 tablet (150 mg total) by mouth daily. Needs office visit for further refills  Chronic right-sided low back pain with right-sided sciatica No red flag symptoms. Steroid IM today as below. Continue tylenol, NSAIDs prn, home PT. Discussed ortho referral if symptoms worsen or do not improve.  -     methylPREDNISolone acetate (DEPO-MEDROL) injection 80 mg  Return in about 13 weeks (around 09/04/2023) for CPE.   The patient indicates understanding of these issues and agrees with the plan.  Amanda Huger, FNP

## 2023-09-17 ENCOUNTER — Encounter: Payer: Self-pay | Admitting: Family Medicine

## 2023-09-17 ENCOUNTER — Ambulatory Visit (INDEPENDENT_AMBULATORY_CARE_PROVIDER_SITE_OTHER): Admitting: Family Medicine

## 2023-09-17 VITALS — BP 131/79 | HR 80 | Temp 98.5°F | Ht 61.0 in | Wt 174.6 lb

## 2023-09-17 DIAGNOSIS — E7800 Pure hypercholesterolemia, unspecified: Secondary | ICD-10-CM | POA: Diagnosis not present

## 2023-09-17 DIAGNOSIS — R748 Abnormal levels of other serum enzymes: Secondary | ICD-10-CM

## 2023-09-17 DIAGNOSIS — E611 Iron deficiency: Secondary | ICD-10-CM | POA: Diagnosis not present

## 2023-09-17 DIAGNOSIS — E559 Vitamin D deficiency, unspecified: Secondary | ICD-10-CM | POA: Diagnosis not present

## 2023-09-17 DIAGNOSIS — E66811 Obesity, class 1: Secondary | ICD-10-CM

## 2023-09-17 DIAGNOSIS — Z6832 Body mass index (BMI) 32.0-32.9, adult: Secondary | ICD-10-CM

## 2023-09-17 DIAGNOSIS — Z0000 Encounter for general adult medical examination without abnormal findings: Secondary | ICD-10-CM

## 2023-09-17 DIAGNOSIS — R202 Paresthesia of skin: Secondary | ICD-10-CM

## 2023-09-17 DIAGNOSIS — Z0001 Encounter for general adult medical examination with abnormal findings: Secondary | ICD-10-CM | POA: Diagnosis not present

## 2023-09-17 DIAGNOSIS — E6609 Other obesity due to excess calories: Secondary | ICD-10-CM

## 2023-09-17 DIAGNOSIS — R233 Spontaneous ecchymoses: Secondary | ICD-10-CM

## 2023-09-17 LAB — COAGUCHEK XS/INR WAIVED
INR: 1 (ref 0.9–1.1)
Prothrombin Time: 11.4 s

## 2023-09-17 NOTE — Progress Notes (Signed)
 Complete physical exam  Patient: Amanda [name]   DOB: 1971/01/28   53 y.o. Female  MRN: [id number]  Subjective:    Chief Complaint  Patient presents with   Annual Exam    Amanda [name] is a 53 y.o. female who presents today for a complete physical exam. She reports consuming a general diet. She does yard work weekly. She generally feels well. She reports sleeping well. She does have additional problems to discuss today.   She has noticed easy bruising lately. Reports without known trauma.   Tingling in hands in the morning when she wakes up. Both hands, entire hand. Denies wrist pain. Does computer work.   Most recent fall risk assessment:    09/17/2023    8:59 AM  Fall Risk   Falls in the past year? 0     Most recent depression screenings:    09/17/2023    9:00 AM 06/05/2023    9:20 AM  PHQ 2/9 Scores  PHQ - 2 Score 0 0  PHQ- 9 Score 0 0    Vision:Within last year and Dental: No current dental problems and Receives regular dental care    Patient Care Team: Joesph Annabella HERO, FNP as PCP - General (Family Medicine)   Outpatient Medications Prior to Visit  Medication Sig   aspirin-acetaminophen-caffeine (EXCEDRIN MIGRAINE) 250-250-65 MG tablet Take by mouth.   buPROPion  (WELLBUTRIN  XL) 150 MG 24 hr tablet Take 1 tablet (150 mg total) by mouth daily. Needs office visit for further refills   Cholecalciferol (VITAMIN D3) 25 MCG (1000 UT) CAPS Take 1,000 Units by mouth daily.   diclofenac  (VOLTAREN ) 75 MG EC tablet Take 75 mg by mouth 2 (two) times daily.   omeprazole  (PRILOSEC) 20 MG capsule Take 1 capsule (20 mg total) by mouth daily.   [DISCONTINUED] ferrous sulfate 325 (65 FE) MG EC tablet Take 325 mg by mouth 3 (three) times daily with meals.   No facility-administered medications prior to visit.    ROS Negative unless specially indicated above in HPI.     Objective:     BP 131/79   Pulse 80   Temp 98.5 F (36.9 C) (Temporal)   Ht 5' 1 (1.549 m)   Wt  174 lb 9.6 oz (79.2 kg)   SpO2 97%   BMI 32.99 kg/m    Physical Exam Vitals and nursing note reviewed.  Constitutional:      General: She is not in acute distress.    Appearance: Normal appearance. She is not ill-appearing.  HENT:     Head: Normocephalic.     Right Ear: Tympanic membrane, ear canal and external ear normal.     Left Ear: Tympanic membrane, ear canal and external ear normal.     Nose: Nose normal.     Mouth/Throat:     Mouth: Mucous membranes are moist.     Pharynx: Oropharynx is clear.  Eyes:     Extraocular Movements: Extraocular movements intact.     Conjunctiva/sclera: Conjunctivae normal.     Pupils: Pupils are equal, round, and reactive to light.  Neck:     Thyroid: No thyroid mass, thyromegaly or thyroid tenderness.  Cardiovascular:     Rate and Rhythm: Normal rate and regular rhythm.     Pulses: Normal pulses.     Heart sounds: Normal heart sounds. No murmur heard.    No friction rub. No gallop.  Pulmonary:     Effort: Pulmonary effort is normal.     Breath  sounds: Normal breath sounds.  Abdominal:     General: Bowel sounds are normal. There is no distension.     Palpations: Abdomen is soft. There is no mass.     Tenderness: There is no abdominal tenderness. There is no guarding.  Musculoskeletal:     Right wrist: Normal.     Left wrist: Normal.     Right hand: Normal.     Left hand: Normal.     Cervical back: Normal range of motion and neck supple. No tenderness.     Right lower leg: No edema.     Comments: - phalens  Skin:    General: Skin is warm and dry.     Capillary Refill: Capillary refill takes less than 2 seconds.     Findings: No lesion or rash.  Neurological:     General: No focal deficit present.     Mental Status: She is alert and oriented to person, place, and time.     Cranial Nerves: No cranial nerve deficit.     Motor: No weakness.     Gait: Gait normal.  Psychiatric:        Mood and Affect: Mood normal.        Behavior:  Behavior normal.        Thought Content: Thought content normal.        Judgment: Judgment normal.      No results found for any visits on 09/17/23.     Assessment & Plan:    Routine Health Maintenance and Physical Exam  Erielle was seen today for annual exam.  Diagnoses and all orders for this visit:  Routine general medical examination at a health care facility  Vitamin D  deficiency -     VITAMIN D  25 Hydroxy (Vit-D Deficiency, Fractures)  Iron deficiency Labs pending.  -     Anemia Profile B  Hypercholesteremia -     Lipid panel  Class 1 obesity due to excess calories with serious comorbidity and body mass index (BMI) of 32.0 to 32.9 in adult Labs pending.  -     CBC with Differential/Platelet -     CMP14+EGFR -     TSH  Easy bruising Will check INR, anemia panel. -     CoaguChek XS/INR Waived  Paresthesias Bilateral hands. B12 pending. Discussed wrist bracing for possible carpal tunnel syndrome.   Elevated alk phos Labs pending as below.  -     Alkaline Phosphatase Isoenzymes -     Specimen status report    Immunization History  Administered Date(s) Administered   Janssen (J&J) SARS-COV-2 Vaccination 06/30/2019   Tdap 11/22/2007, 10/21/2017    Health Maintenance  Topic Date Due   Zoster Vaccines- Shingrix (1 of 2) 12/05/2023 (Originally 04/24/2018)   INFLUENZA VACCINE  05/03/2024 (Originally 09/04/2023)   Pneumococcal Vaccine: 50+ Years (1 of 1 - PCV) 09/16/2024 (Originally 04/24/2018)   Hepatitis B Vaccines 19-59 Average Risk (1 of 3 - 19+ 3-dose series) 09/16/2024 (Originally 04/24/1987)   Hepatitis C Screening  09/16/2024 (Originally 04/24/1986)   HIV Screening  09/16/2024 (Originally 04/24/1983)   COVID-19 Vaccine (2 - 2024-25 season) 10/02/2024 (Originally 10/05/2022)   Fecal DNA (Cologuard)  01/08/2024   MAMMOGRAM  03/15/2024   Cervical Cancer Screening (HPV/Pap Cotest)  05/03/2026   DTaP/Tdap/Td (3 - Td or Tdap) 10/22/2027   HPV VACCINES  Aged Out    Meningococcal B Vaccine  Aged Out    Discussed health benefits of physical activity, and encouraged her to  engage in regular exercise appropriate for her age and condition.  Problem List Items Addressed This Visit       Other   Hypercholesteremia   Relevant Orders   Lipid panel (Completed)   Obesity (BMI 30.0-34.9)   Vitamin D  deficiency   Relevant Orders   VITAMIN D  25 Hydroxy (Vit-D Deficiency, Fractures) (Completed)   Other Visit Diagnoses       Routine general medical examination at a health care facility    -  Primary     Iron deficiency       Relevant Orders   Anemia Profile B (Completed)     Easy bruising       Relevant Orders   CoaguChek XS/INR Waived (Completed)     Paresthesias         Elevated alkaline phosphatase level       Relevant Orders   Alkaline Phosphatase Isoenzymes (Completed)   Specimen status report (Completed)      Return in about 6 months (around 03/19/2024) for medication follow up.   The patient indicates understanding of these issues and agrees with the plan.  Annabella CHRISTELLA Search, FNP

## 2023-09-18 LAB — ANEMIA PROFILE B
Basophils Absolute: 0.1 x10E3/uL (ref 0.0–0.2)
Basos: 1 %
EOS (ABSOLUTE): 0.4 x10E3/uL (ref 0.0–0.4)
Eos: 8 %
Ferritin: 39 ng/mL (ref 15–150)
Folate: 7.9 ng/mL (ref >3.0)
Hematocrit: 40.2 % (ref 34.0–46.6)
Hemoglobin: 12.6 g/dL (ref 11.1–15.9)
Immature Grans (Abs): 0 x10E3/uL (ref 0.0–0.1)
Immature Granulocytes: 0 %
Iron Saturation: 11 % — ABNORMAL LOW (ref 15–55)
Iron: 42 ug/dL (ref 27–159)
Lymphocytes Absolute: 1.8 x10E3/uL (ref 0.7–3.1)
Lymphs: 35 %
MCH: 29 pg (ref 26.6–33.0)
MCHC: 31.3 g/dL — ABNORMAL LOW (ref 31.5–35.7)
MCV: 92 fL (ref 79–97)
Monocytes Absolute: 0.5 x10E3/uL (ref 0.1–0.9)
Monocytes: 9 %
Neutrophils Absolute: 2.4 x10E3/uL (ref 1.4–7.0)
Neutrophils: 47 %
Platelets: 420 x10E3/uL (ref 150–450)
RBC: 4.35 x10E6/uL (ref 3.77–5.28)
RDW: 13.2 % (ref 11.7–15.4)
Retic Ct Pct: 1.7 % (ref 0.6–2.6)
Total Iron Binding Capacity: 368 ug/dL (ref 250–450)
UIBC: 326 ug/dL (ref 131–425)
Vitamin B-12: 678 pg/mL (ref 232–1245)
WBC: 5.2 x10E3/uL (ref 3.4–10.8)

## 2023-09-18 LAB — CMP14+EGFR
ALT: 28 IU/L (ref 0–32)
AST: 29 IU/L (ref 0–40)
Albumin: 4.2 g/dL (ref 3.8–4.9)
Alkaline Phosphatase: 156 IU/L — ABNORMAL HIGH (ref 44–121)
BUN/Creatinine Ratio: 16 (ref 9–23)
BUN: 14 mg/dL (ref 6–24)
Bilirubin Total: 0.4 mg/dL (ref 0.0–1.2)
CO2: 20 mmol/L (ref 20–29)
Calcium: 10.3 mg/dL — ABNORMAL HIGH (ref 8.7–10.2)
Chloride: 102 mmol/L (ref 96–106)
Creatinine, Ser: 0.85 mg/dL (ref 0.57–1.00)
Globulin, Total: 3 g/dL (ref 1.5–4.5)
Glucose: 87 mg/dL (ref 70–99)
Potassium: 4.6 mmol/L (ref 3.5–5.2)
Sodium: 139 mmol/L (ref 134–144)
Total Protein: 7.2 g/dL (ref 6.0–8.5)
eGFR: 81 mL/min/1.73 (ref >59)

## 2023-09-18 LAB — VITAMIN D 25 HYDROXY (VIT D DEFICIENCY, FRACTURES): Vit D, 25-Hydroxy: 33.9 ng/mL (ref 30.0–100.0)

## 2023-09-18 LAB — LIPID PANEL
Chol/HDL Ratio: 3.9 ratio (ref 0.0–4.4)
Cholesterol, Total: 291 mg/dL — ABNORMAL HIGH (ref 100–199)
HDL: 75 mg/dL (ref >39)
LDL Chol Calc (NIH): 205 mg/dL — ABNORMAL HIGH (ref 0–99)
Triglycerides: 71 mg/dL (ref 0–149)
VLDL Cholesterol Cal: 11 mg/dL (ref 5–40)

## 2023-09-18 LAB — TSH: TSH: 0.883 u[IU]/mL (ref 0.450–4.500)

## 2023-09-21 ENCOUNTER — Ambulatory Visit: Payer: Self-pay | Admitting: Family Medicine

## 2023-09-21 DIAGNOSIS — R748 Abnormal levels of other serum enzymes: Secondary | ICD-10-CM

## 2023-09-21 MED ORDER — ROSUVASTATIN CALCIUM 10 MG PO TABS
10.0000 mg | ORAL_TABLET | Freq: Every day | ORAL | 3 refills | Status: AC
Start: 2023-09-21 — End: ?

## 2023-09-25 LAB — ALKALINE PHOSPHATASE ISOENZYMES
Alkaline Phosphatase: 150 IU/L — AB (ref 44–121)
BONE FRACTION %:: 42
Bone Fraction IU/L:: 63 IU/L — AB (ref 18–57)
INTESTINAL FRAC.%:: 0
INTESTINALFRAC.IU/L:: 0 IU/L (ref 0–14)
LIVER FRACTION %:: 58
Liver Fraction IU/L:: 87 IU/L — AB (ref 23–85)

## 2023-09-25 LAB — SPECIMEN STATUS REPORT

## 2023-09-30 ENCOUNTER — Encounter: Payer: Self-pay | Admitting: Family Medicine

## 2023-09-30 DIAGNOSIS — Z809 Family history of malignant neoplasm, unspecified: Secondary | ICD-10-CM

## 2023-10-01 NOTE — Addendum Note (Signed)
 Addended by: RANDINE ARNULFO MATSU on: 10/01/2023 09:04 AM   Modules accepted: Orders

## 2023-10-02 ENCOUNTER — Other Ambulatory Visit

## 2023-10-02 DIAGNOSIS — R748 Abnormal levels of other serum enzymes: Secondary | ICD-10-CM

## 2023-10-03 LAB — GAMMA GT: GGT: 95 IU/L — ABNORMAL HIGH (ref 0–60)

## 2023-10-07 ENCOUNTER — Ambulatory Visit: Payer: Self-pay | Admitting: Family Medicine

## 2023-10-07 DIAGNOSIS — R748 Abnormal levels of other serum enzymes: Secondary | ICD-10-CM

## 2023-10-15 ENCOUNTER — Ambulatory Visit (HOSPITAL_COMMUNITY)
Admission: RE | Admit: 2023-10-15 | Discharge: 2023-10-15 | Disposition: A | Discharge status: Home / Self Care | Source: Ambulatory Visit | Attending: Family Medicine | Admitting: Family Medicine

## 2023-10-15 DIAGNOSIS — R748 Abnormal levels of other serum enzymes: Secondary | ICD-10-CM | POA: Insufficient documentation

## 2023-10-19 ENCOUNTER — Ambulatory Visit: Payer: Self-pay | Admitting: Family Medicine

## 2023-10-19 DIAGNOSIS — R748 Abnormal levels of other serum enzymes: Secondary | ICD-10-CM

## 2023-10-19 DIAGNOSIS — K760 Fatty (change of) liver, not elsewhere classified: Secondary | ICD-10-CM

## 2023-12-23 ENCOUNTER — Encounter: Payer: Self-pay | Admitting: Internal Medicine

## 2023-12-23 IMAGING — MG MM DIGITAL SCREENING BILAT W/ TOMO AND CAD
8 series · 8 of 24 positions shown · non-contrast
Comparison: Previous exam(s).

CLINICAL DATA: Screening.

EXAM:
DIGITAL SCREENING BILATERAL MAMMOGRAM WITH TOMOSYNTHESIS AND CAD
TECHNIQUE: Bilateral screening digital craniocaudal and mediolateral oblique
mammograms were obtained. Bilateral screening digital breast
tomosynthesis was performed. The images were evaluated with
computer-aided detection.

[R CC synth-2D]
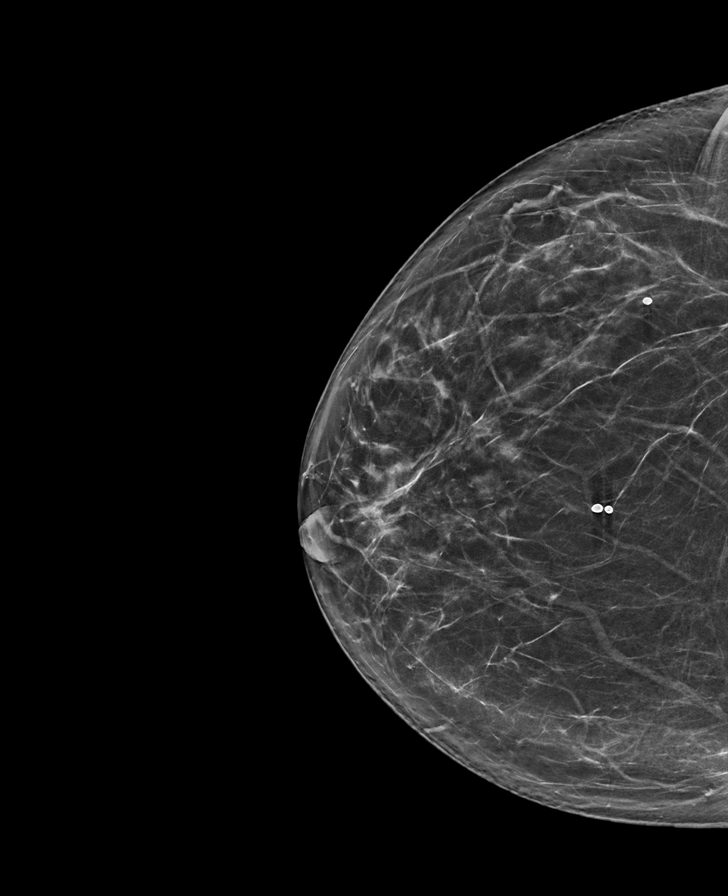

[L MLO synth-2D]
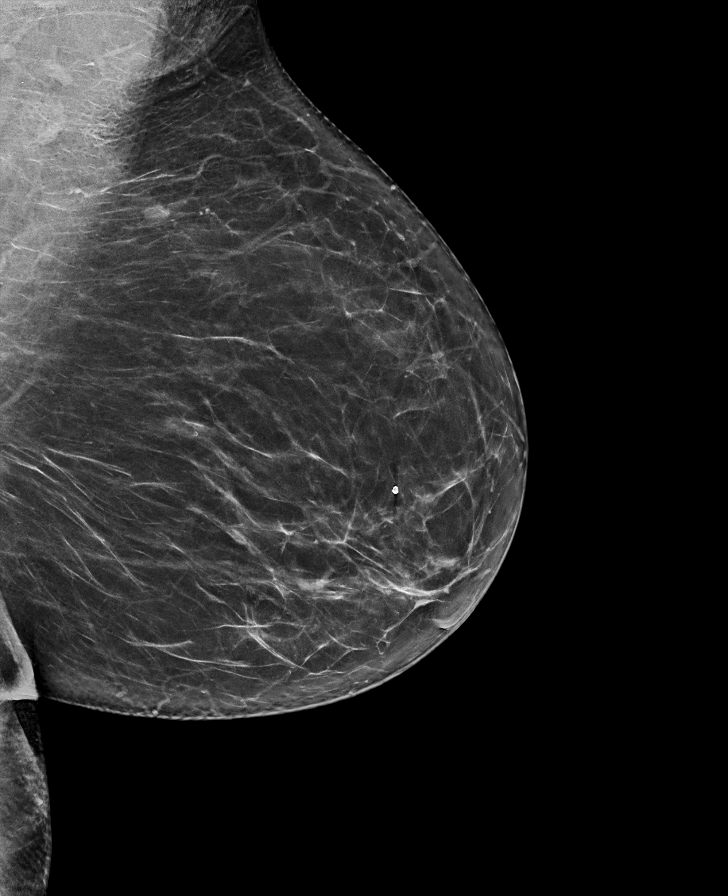

[L CC synth-2D]
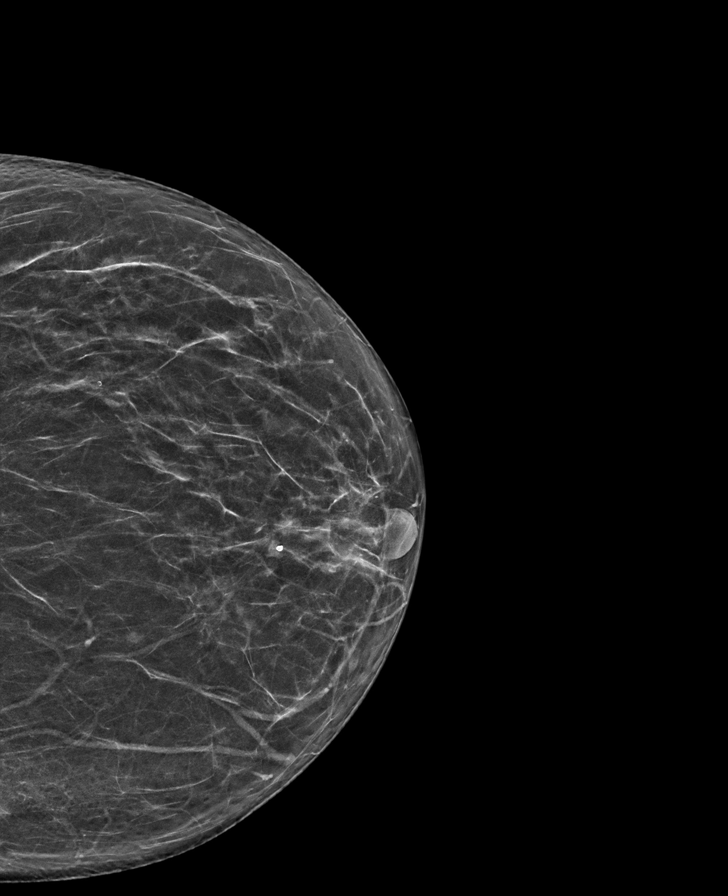

[R MLO synth-2D]
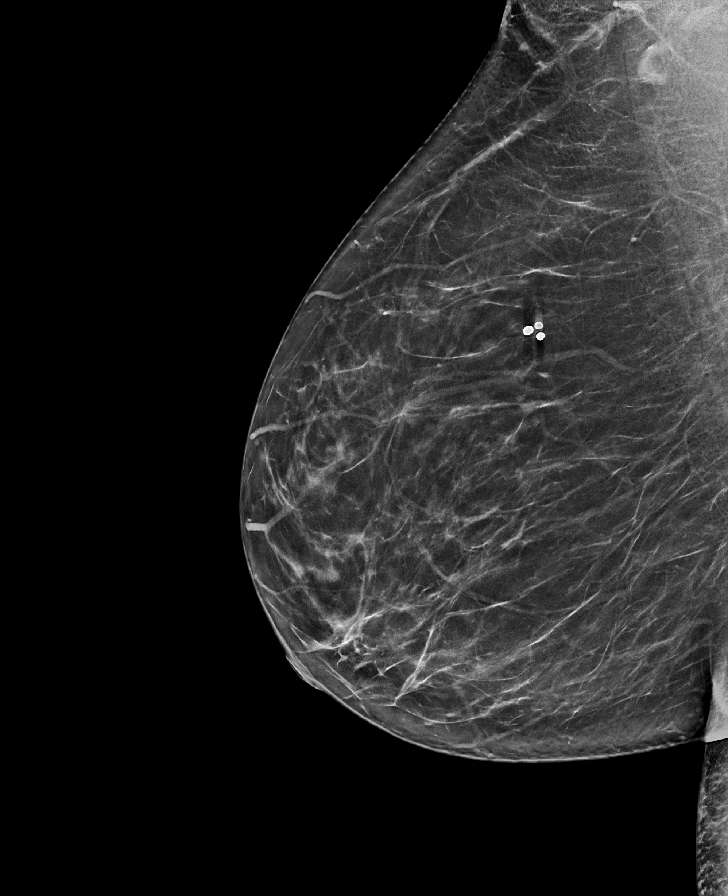

[R MLO tomo · tomo slice 39/78.0]
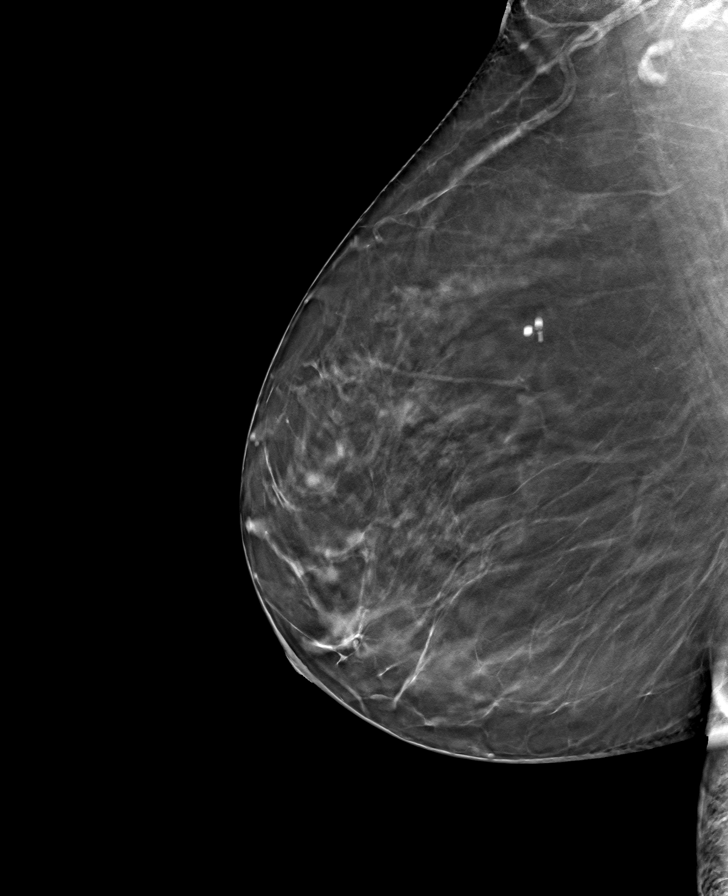

[L MLO tomo · tomo slice 41/81.0]
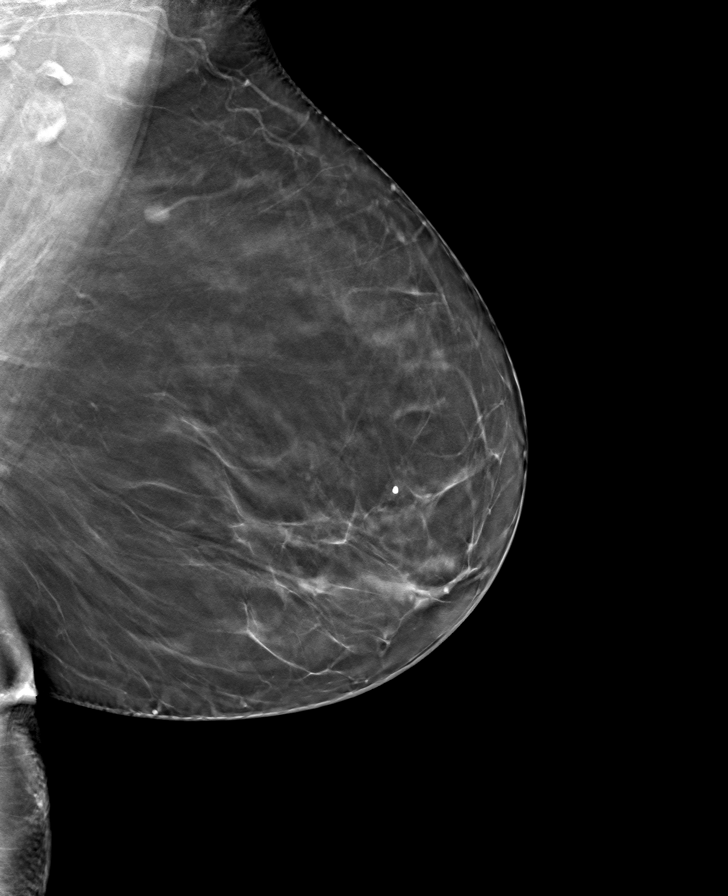

[L CC tomo · tomo slice 34/67.0]
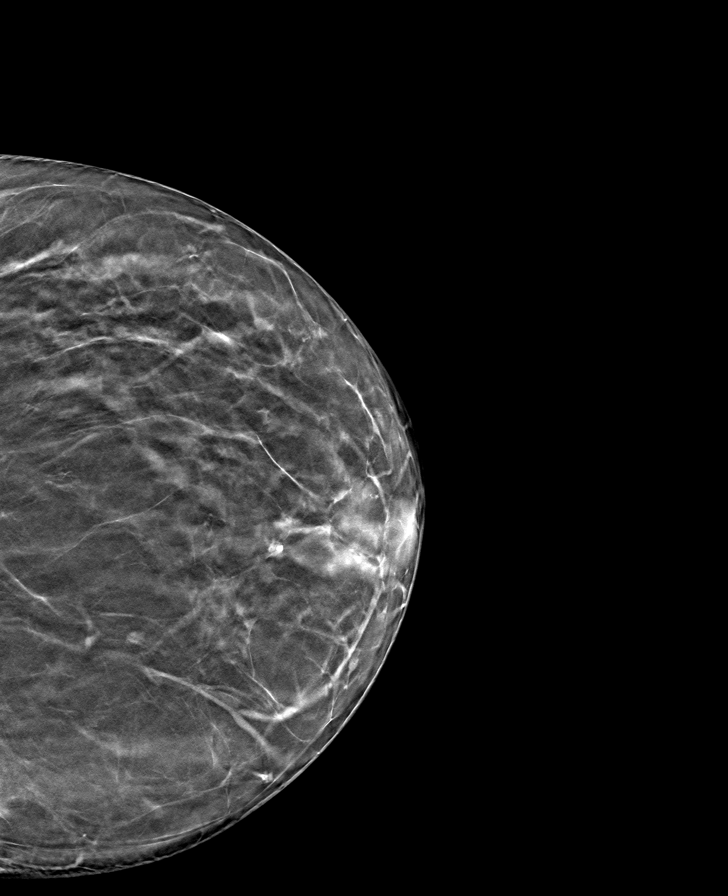

[R CC tomo · tomo slice 39/78.0]
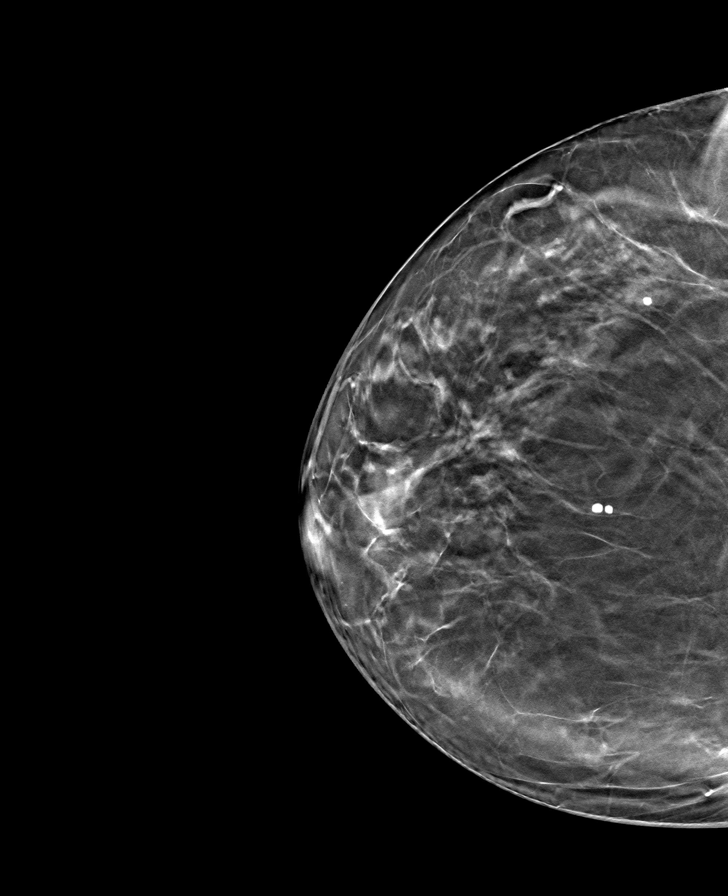

[8 of 24 positions shown; findings below may reference images not displayed]

ACR Breast Density Category c: The breast tissue is heterogeneously
dense, which may obscure small masses.
FINDINGS: There are no findings suspicious for malignancy.
IMPRESSION: No mammographic evidence of malignancy. A result letter of this
screening mammogram will be mailed directly to the patient.

RECOMMENDATION:
Screening mammogram in one year. (Code:Q3-W-BC3)

BI-RADS CATEGORY  1: Negative.

## 2024-01-04 ENCOUNTER — Encounter: Payer: Self-pay | Admitting: Internal Medicine

## 2024-01-07 ENCOUNTER — Ambulatory Visit: Payer: Self-pay | Admitting: Family Medicine

## 2024-01-08 ENCOUNTER — Inpatient Hospital Stay

## 2024-02-08 ENCOUNTER — Encounter: Payer: Self-pay | Admitting: Gastroenterology

## 2024-02-22 ENCOUNTER — Ambulatory Visit: Admitting: Family Medicine

## 2024-02-29 ENCOUNTER — Ambulatory Visit: Admitting: Gastroenterology

## 2024-03-21 ENCOUNTER — Ambulatory Visit: Payer: Self-pay | Admitting: Family Medicine

## 2024-03-28 ENCOUNTER — Ambulatory Visit: Admitting: Gastroenterology
